# Patient Record
Sex: Male | Born: 1970 | Race: Black or African American | Hispanic: No | Marital: Married | Smoking: Never smoker
Health system: Southern US, Community
[De-identification: ages and names within clinical notes are randomized; demographics above are authoritative.]

## PROBLEM LIST (undated history)

## (undated) DIAGNOSIS — T7840XA Allergy, unspecified, initial encounter: Secondary | ICD-10-CM

## (undated) DIAGNOSIS — K635 Polyp of colon: Secondary | ICD-10-CM

## (undated) DIAGNOSIS — K8020 Calculus of gallbladder without cholecystitis without obstruction: Secondary | ICD-10-CM

## (undated) HISTORY — DX: Allergy, unspecified, initial encounter: T78.40XA

## (undated) HISTORY — DX: Polyp of colon: K63.5

## (undated) HISTORY — DX: Calculus of gallbladder without cholecystitis without obstruction: K80.20

---

## 1994-07-22 HISTORY — PX: WISDOM TOOTH EXTRACTION: SHX21

## 1999-07-23 DIAGNOSIS — K8020 Calculus of gallbladder without cholecystitis without obstruction: Secondary | ICD-10-CM

## 1999-07-23 HISTORY — DX: Calculus of gallbladder without cholecystitis without obstruction: K80.20

## 2005-03-20 ENCOUNTER — Ambulatory Visit: Payer: Self-pay | Admitting: Family Medicine

## 2005-05-13 ENCOUNTER — Ambulatory Visit: Payer: Self-pay | Admitting: Gastroenterology

## 2007-09-28 IMAGING — NM NUCLEAR MEDICINE GASTRIC EMPTYING STUDY
1 series · 6 of 6 positions shown · non-contrast
Comparison: none

REASON FOR EXAM: Dysphagia, epigastric pain
COMMENTS:

PROCEDURE:     NM  - NM GASTRIC EMPTYING STUDY  - May 13, 2005  [DATE]
RESULT:     Following administration of 2.07 millicuries of technetium-99m
sulfur colloid with a solid meal, a gastric emptying study was performed.
Gastric emptying at 95 minutes is 21% which is abnormal.

[Series 0: gastric empty · 1.7mm · 1.65mm/px · 6 of 20 frames shown]
[frame 2/20]
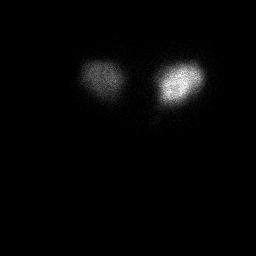
[frame 5/20]
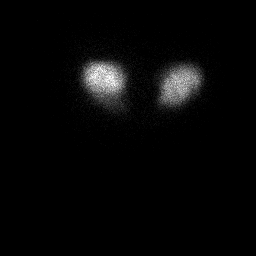
[frame 9/20]
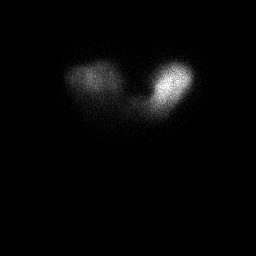
[frame 12/20]
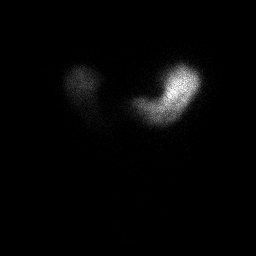
[frame 15/20]
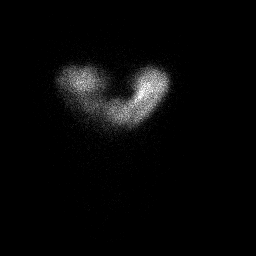
[frame 19/20]
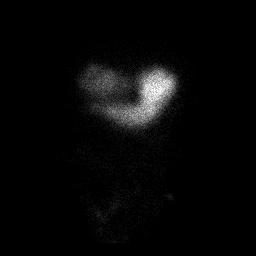

[6 of 6 positions shown; findings below may reference images not displayed]

IMPRESSION: Delayed gastric emptying.

## 2007-11-03 DIAGNOSIS — E881 Lipodystrophy, not elsewhere classified: Secondary | ICD-10-CM | POA: Insufficient documentation

## 2008-07-22 DIAGNOSIS — K635 Polyp of colon: Secondary | ICD-10-CM

## 2008-07-22 HISTORY — DX: Polyp of colon: K63.5

## 2008-09-15 ENCOUNTER — Ambulatory Visit: Payer: Self-pay | Admitting: General Surgery

## 2012-05-12 DIAGNOSIS — E119 Type 2 diabetes mellitus without complications: Secondary | ICD-10-CM | POA: Insufficient documentation

## 2012-10-05 ENCOUNTER — Ambulatory Visit: Payer: Self-pay | Admitting: Family Medicine

## 2012-10-20 ENCOUNTER — Ambulatory Visit: Payer: Self-pay | Admitting: Family Medicine

## 2013-09-01 ENCOUNTER — Ambulatory Visit: Payer: Self-pay | Admitting: General Surgery

## 2013-09-14 ENCOUNTER — Encounter: Payer: Self-pay | Admitting: General Surgery

## 2013-09-14 ENCOUNTER — Other Ambulatory Visit: Payer: Self-pay | Admitting: General Surgery

## 2013-09-14 ENCOUNTER — Ambulatory Visit (INDEPENDENT_AMBULATORY_CARE_PROVIDER_SITE_OTHER): Payer: BC Managed Care – PPO | Admitting: General Surgery

## 2013-09-14 VITALS — BP 148/94 | HR 82 | Resp 14 | Ht 71.0 in | Wt 273.0 lb

## 2013-09-14 DIAGNOSIS — G4730 Sleep apnea, unspecified: Secondary | ICD-10-CM

## 2013-09-14 DIAGNOSIS — Z860100 Personal history of colon polyps, unspecified: Secondary | ICD-10-CM | POA: Insufficient documentation

## 2013-09-14 DIAGNOSIS — Z1211 Encounter for screening for malignant neoplasm of colon: Secondary | ICD-10-CM

## 2013-09-14 MED ORDER — POLYETHYLENE GLYCOL 3350 17 GM/SCOOP PO POWD: 1.0000 | Freq: Once | ORAL | Status: DC

## 2013-09-14 NOTE — Patient Instructions (Addendum)
Colonoscopy A colonoscopy is an exam to look at the entire large intestine (colon). This exam can help find problems such as tumors, polyps, inflammation, and areas of bleeding. The exam takes about 1 hour.  LET [facility] CARE PROVIDER KNOW ABOUT:   Any allergies you have.  All medicines you are taking, including vitamins, herbs, eye drops, creams, and over-the-counter medicines.  Previous problems you or members of your family have had with the use of anesthetics.  Any blood disorders you have.  Previous surgeries you have had.  Medical conditions you have. RISKS AND COMPLICATIONS  Generally, this is a safe procedure. However, as with any procedure, complications can occur. Possible complications include:  Bleeding.  Tearing or rupture of the colon wall.  Reaction to medicines given during the exam.  Infection (rare). BEFORE THE PROCEDURE   Ask your health care provider about changing or stopping your regular medicines.  You may be prescribed an oral bowel prep. This involves drinking a large amount of medicated liquid, starting the day before your procedure. The liquid will cause you to have multiple loose stools until your stool is almost clear or light green. This cleans out your colon in preparation for the procedure.  Do not eat or drink anything else once you have started the bowel prep, unless your health care provider tells you it is safe to do so.  Arrange for someone to drive you home after the procedure. PROCEDURE   You will be given medicine to help you relax (sedative).  You will lie on your side with your knees bent.  A long, flexible tube with a light and camera on the end (colonoscope) will be inserted through the rectum and into the colon. The camera sends video back to a computer screen as it moves through the colon. The colonoscope also releases carbon dioxide gas to inflate the colon. This helps your health care provider see the area better.  During  the exam, your health care provider may take a small tissue sample (biopsy) to be examined under a microscope if any abnormalities are found.  The exam is finished when the entire colon has been viewed. AFTER THE PROCEDURE   Do not drive for 24 hours after the exam.  You may have a small amount of blood in your stool.  You may pass moderate amounts of gas and have mild abdominal cramping or bloating. This is caused by the gas used to inflate your colon during the exam.  Ask when your test results will be ready and how you will get your results. Make sure you get your test results. Document Released: 07/05/2000 Document Revised: 04/28/2013 Document Reviewed: 03/15/2013 [facility] Patient Information 2014 [place].    Patient is scheduled for a colonoscopy at [facility] for 10/12/13. Patient is to take his Tenoretic blood pressure medication at 6:00 am the day of his colonoscopy. patient is aware of date, time, and instructions. patient is aware to pre admit at least 2 days prior to his colonoscopy.

## 2013-09-14 NOTE — Progress Notes (Signed)
Patient ID: Douglas Murray, male   DOB: [birth year], 43 y.o.   MRN: [id number]  Chief Complaint  Patient presents with  . Other    5 year follow up colonoscopy    HPI Douglas Murray is a 43 y.o. male who presents for an evaluation of a colonoscopy. The last colonoscopy was performed on 09/15/2008. The patient denies any problems at this time. He has noticed a little bleeding on tissues and on stool but only when he is constipated.  Bowels generally move daily and no rectal pain.  The patient was originally evaluated in January of 2002 when he was identified with left lower quadrant pain as well as an incidental finding of cholelithiasis on ultrasound. Colonoscopy at that time showed a 1.7 cm pedunculated polyp in the descending colon. Pathology showed this to be a tubular adenoma with a well-defined stalk with clear margins.  Air-contrast barium enema completed 08/26/2003 was notable for a few diverticuli in the descending colon.  Colonoscopy dated 09/15/2008 showed a few diverticula in the sigmoid colon. No evidence of recurrent polyps. The cecum was reached on this exam.  HPI  Past Medical History  Diagnosis Date  . Diabetes mellitus without complication   . Hyperlipidemia   . Colon polyp 2010    Past Surgical History  Procedure Laterality Date  . Colonoscopy  09-15-08    Dr Bary Castilla  . Wisdom tooth extraction  1996    History reviewed. No pertinent family history.  Social History History  Substance Use Topics  . Smoking status: Never Smoker   . Smokeless tobacco: Never Used  . Alcohol Use: No    No Known Allergies  Current Outpatient Prescriptions  Medication Sig Dispense Refill  . atenolol-chlorthalidone (TENORETIC) 50-25 MG per tablet Take 1 tablet by mouth daily.      . polyethylene glycol powder (GLYCOLAX/MIRALAX) powder Take 255 g (1 Container total) by mouth once.  255 g  0   No current facility-administered medications for this visit.    Review of  Systems Review of Systems  Constitutional: Negative.   Respiratory: Negative.   Cardiovascular: Negative.   Gastrointestinal: Positive for constipation.    Blood pressure 148/94, pulse 82, resp. rate 14, height 5\' 11"  (1.803 m), weight 273 lb (123.832 kg).  Physical Exam Physical Exam  Constitutional: He is oriented to person, place, and time. He appears well-developed and well-nourished.  Neck: Neck supple.  Cardiovascular: Normal rate, regular rhythm and normal heart sounds.   Pulmonary/Chest: Effort normal and breath sounds normal.  Abdominal: Soft. Bowel sounds are normal.  Lymphadenopathy:    He has no cervical adenopathy.  Neurological: He is alert and oriented to person, place, and time.  Skin: Skin is warm and dry.    Data Reviewed See above.   Assessment    Previously resected 1.7 cm tubular adenoma of the descending colon.  History of occasional bright red blood on the toilet tissue or on the leading edge of the stool    Plan    At the time of his previous colonoscopies significant snoring and a suggestion of sleep apnea was identified. The patient had been encouraged to discuss with his primary physician, Douglas Murray, M.D., evaluation for sleep apnea. He received a referral but never followed through.  He reports his wife still tells me stores significantly. Considering his age, race, hypertension and diabetes, if indeed he does have sleep apnea he is at high risk for pulmonary hypertension and an early demise. Importance of  formal evaluation was encouraged.  Indications for repeat colonoscopy were reviewed as well as the risks and benefits of the procedure. Colonoscopy with possible biopsy/polypectomy prn: Information regarding the procedure, including its potential risks and complications (including but not limited to perforation of the bowel, which may require emergency surgery to repair, and bleeding) was verbally given to the patient. Educational information  regarding lower instestinal endoscopy was given to the patient. Written instructions for how to complete the bowel prep using Miralax were provided. The importance of drinking ample fluids to avoid dehydration as a result of the prep emphasized.     Patient is scheduled for a colonoscopy at [facility] for 10/12/13. Patient is to take his Tenoretic blood pressure medication at 6:00 am the day of his colonoscopy. patient is aware of date, time, and instructions. patient is aware to pre admit at least 2 days prior to his colonoscopy. Miralax prescription sent into his pharmacy.  Robert Bellow 09/14/2013, 1:25 PM

## 2013-10-11 HISTORY — PX: COLONOSCOPY: SHX174

## 2013-10-12 ENCOUNTER — Ambulatory Visit: Payer: Self-pay | Admitting: General Surgery

## 2013-10-12 DIAGNOSIS — D128 Benign neoplasm of rectum: Secondary | ICD-10-CM

## 2013-10-12 DIAGNOSIS — D129 Benign neoplasm of anus and anal canal: Secondary | ICD-10-CM

## 2013-10-12 LAB — HM COLONOSCOPY

## 2013-10-13 ENCOUNTER — Encounter: Payer: Self-pay | Admitting: General Surgery

## 2013-10-16 LAB — PATHOLOGY REPORT

## 2013-10-18 ENCOUNTER — Telehealth: Payer: Self-pay | Admitting: *Deleted

## 2013-10-18 ENCOUNTER — Encounter: Payer: Self-pay | Admitting: General Surgery

## 2013-10-18 NOTE — Telephone Encounter (Signed)
Message copied by Chrystie Nose on Mon Oct 18, 2013 12:59 PM ------      Message from: [place], Forest Gleason      Created: Mon Oct 18, 2013 10:58 AM       Janett Billow: Please notify the patient of the pathology was benign. As he has had 2 normal exams we'll plan for repeat study in 8 years.            Michelle: Please put the patient recalls for 2023. Thank you      ----- Message -----         From: Verlene Mayer, CMA         Sent: 10/18/2013   9:28 AM           To: Robert Bellow, MD                   ------

## 2013-10-18 NOTE — Telephone Encounter (Signed)
Notified patient as instructed, patient pleased. Discussed follow-up appointments, patient agrees  

## 2013-11-12 ENCOUNTER — Ambulatory Visit: Payer: Self-pay | Admitting: Family Medicine

## 2013-11-12 DIAGNOSIS — G4733 Obstructive sleep apnea (adult) (pediatric): Secondary | ICD-10-CM | POA: Insufficient documentation

## 2014-10-10 LAB — LIPID PANEL
Cholesterol: 198 mg/dL (ref 0–200)
HDL: 27 mg/dL — AB (ref 35–70)
LDL CALC: 149 mg/dL
Triglycerides: 112 mg/dL (ref 40–160)

## 2014-10-10 LAB — HEMOGLOBIN A1C: Hgb A1c MFr Bld: 6.7 % — AB (ref 4.0–6.0)

## 2014-10-10 LAB — BASIC METABOLIC PANEL
BUN: 9 mg/dL (ref 4–21)
Creatinine: 1.3 mg/dL (ref <=1.3)
GLUCOSE: 135 mg/dL
Potassium: 4 mmol/L (ref 3.4–5.3)
Sodium: 142 mmol/L (ref 137–147)

## 2015-03-10 DIAGNOSIS — E669 Obesity, unspecified: Secondary | ICD-10-CM | POA: Insufficient documentation

## 2015-03-10 DIAGNOSIS — L309 Dermatitis, unspecified: Secondary | ICD-10-CM | POA: Insufficient documentation

## 2015-03-13 ENCOUNTER — Encounter: Payer: Self-pay | Admitting: Family Medicine

## 2015-03-13 ENCOUNTER — Ambulatory Visit (INDEPENDENT_AMBULATORY_CARE_PROVIDER_SITE_OTHER): Payer: 59 | Admitting: Family Medicine

## 2015-03-13 VITALS — BP 118/80 | HR 56 | Temp 98.9°F | Resp 16 | Ht 71.0 in | Wt 261.0 lb

## 2015-03-13 DIAGNOSIS — E669 Obesity, unspecified: Secondary | ICD-10-CM | POA: Diagnosis not present

## 2015-03-13 DIAGNOSIS — E119 Type 2 diabetes mellitus without complications: Secondary | ICD-10-CM

## 2015-03-13 DIAGNOSIS — I10 Essential (primary) hypertension: Secondary | ICD-10-CM | POA: Diagnosis not present

## 2015-03-13 DIAGNOSIS — E881 Lipodystrophy, not elsewhere classified: Secondary | ICD-10-CM

## 2015-03-13 LAB — POCT GLYCOSYLATED HEMOGLOBIN (HGB A1C)
ESTIMATED AVERAGE GLUCOSE: 157
Hemoglobin A1C: 7.1

## 2015-03-13 MED ORDER — METFORMIN HCL ER 500 MG PO TB24: 500.0000 mg | ORAL_TABLET | Freq: Every day | ORAL | Status: DC

## 2015-03-13 NOTE — Progress Notes (Deleted)
Patient: Douglas Murray, Male    DOB: May 15, 1971, 44 y.o.   MRN: [id number] Visit Date: 03/13/2015  Today's Provider: Lelon Huh, MD   Chief Complaint  Patient presents with  . Follow-up    5 months  . Diabetes  . Hypertension   Subjective:    Annual wellness visit Douglas Murray is a 44 y.o. male. He feels {DESC; WELL/FAIRLY WELL/POORLY:18703}. He reports exercising ***. He reports he is sleeping {DESC; WELL/FAIRLY WELL/POORLY:18703}.  -----------------------------------------------------------   Review of Systems  Social History   Social History  . Marital Status: Married    Spouse Name: N/A  . Number of Children: N/A  . Years of Education: N/A   Occupational History  . Not on file.   Social History Main Topics  . Smoking status: Never Smoker   . Smokeless tobacco: Never Used  . Alcohol Use: No  . Drug Use: No  . Sexual Activity: Not on file   Other Topics Concern  . Not on file   Social History Narrative    Patient Active Problem List   Diagnosis Date Noted  . Eczema 03/10/2015  . HLD (hyperlipidemia) 03/10/2015  . Obesity 03/10/2015  . Obstructive sleep apnea of adult 11/12/2013  . Personal history of colonic polyps 09/14/2013  . Diabetes mellitus, type 2 05/12/2012  . Diverticulosis of colon without hemorrhage 09/15/2008  . 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 Allergic rhinitis 08/01/2006  . Essential (primary) hypertension 08/01/2006  . History of colon polyps 08/12/2000    Past Surgical History  Procedure Laterality Date  . Colonoscopy  09-15-08    Dr Bary Castilla  . Wisdom tooth extraction  1996    His family history includes Cancer in his maternal aunt; Diabetes in his mother.    Previous Medications   ATENOLOL-CHLORTHALIDONE (TENORETIC) 50-25 MG PER TABLET    Take 1 tablet by mouth daily.   POLYETHYLENE GLYCOL POWDER (GLYCOLAX/MIRALAX) POWDER    Take 255 g (1 Container total) by mouth once.   PRAVASTATIN (PRAVACHOL) 20 MG TABLET     Take 1 tablet by mouth daily.    Patient Care Team: Birdie Sons, MD as PCP - General (Family Medicine) Robert Bellow, MD (General Surgery)     Objective:   Vitals: There were no vitals taken for this visit.  Physical Exam  Activities of Daily Living No flowsheet data found.  Fall Risk Assessment No flowsheet data found.   Depression Screen No flowsheet data found.  Cognitive Testing - 6-CIT  Correct? Score   What year is it? {yes no:22349} {0-4:31231} 0 or 4  What month is it? {yes no:22349} {0-3:21082} 0 or 3  Memorize:    Pia Mau,  43,  [address],  [place],      What time is it? (within 1 hour) {yes no:22349} {0-3:21082} 0 or 3  Count backwards from 20 {yes no:22349} {0-4:31231} 0, 2, or 4  Name the months of the year {yes no:22349} {0-4:31231} 0, 2, or 4  Repeat name & address above {yes no:22349} {0-10:5044} 0, 2, 4, 6, 8, or 10       TOTAL SCORE  ***/28   Interpretation:  {normal/abnormal:11317::"Normal"}  Normal (0-7) Abnormal (8-28)       Assessment & Plan:     Annual Wellness Visit  Reviewed patient's Family Medical History Reviewed and updated list of patient's medical providers Assessment of cognitive impairment was done Assessed patient's functional ability Established a written schedule for health  screening services Health Risk Assessent Completed and Reviewed  Exercise Activities and Dietary recommendations Goals    None      Immunization History  Administered Date(s) Administered  . Pneumococcal Polysaccharide-23 06/02/2013  . Td 07/23/2005  . Tdap 10/23/2007    Health Maintenance  Topic Date Due  . FOOT EXAM  07/06/1982  . OPHTHALMOLOGY EXAM  07/06/1982  . URINE MICROALBUMIN  07/06/1982  . HIV Screening  07/07/1987  . INFLUENZA VACCINE  02/20/2015  . HEMOGLOBIN A1C  04/12/2015  . TETANUS/TDAP  10/22/2017  . PNEUMOCOCCAL POLYSACCHARIDE VACCINE (2) 06/02/2018      Discussed health benefits of physical activity,  and encouraged him to engage in regular exercise appropriate for his age and condition.    ------------------------------------------------------------------------------------------------------------  c

## 2015-03-13 NOTE — Progress Notes (Signed)
Patient: Douglas Murray Male    DOB: 05/22/1971   44 y.o.   MRN: [id number] Visit Date: 03/13/2015  Today's Provider: Lelon Huh, MD   Chief Complaint  Patient presents with  . Follow-up    5 months  . Diabetes  . Hypertension   Subjective:    HPI  Follow-up for lipodystrophy from 10/10/2014; started pravastatin 20 mg qd. Is tolerating pravastatin well with no adverse effects.  Lab Results  Component Value Date   CHOL 198 10/10/2014   HDL 27* 10/10/2014   LDLCALC 149 10/10/2014   TRIG 112 10/10/2014      Diabetes Mellitus Type II, Follow-up:   Lab Results  Component Value Date   HGBA1C 6.7* 10/10/2014   Last seen for diabetes 5 months ago.  Management changes included none. He reports excellent compliance with treatment. He is not having side effects. none Current symptoms include none and have been stable. Home blood sugar records: fasting range: 126  Episodes of hypoglycemia? no   Weight trend: stable Prior visit with dietician: no Current diet: in general, an "unhealthy" diet Current exercise: walking  ------------------------------------------------------------------------   Hypertension, follow-up:  BP Readings from Last 3 Encounters:  03/13/15 118/80  10/10/14 132/96  09/14/13 148/94    He was last seen for hypertension 5 months ago.  BP at that visit was 132/88. Management changes since that visit include none .He reports excellent compliance with treatment. He is not having side effects. none  He is exercising. He is not adherent to low salt diet.   Outside blood pressures are yes. He is experiencing none.  Patient denies none.   Cardiovascular risk factors include diabetes mellitus.  Use of agents associated with hypertension: none.   ------------------------------------------------------------------------      No Known Allergies Previous Medications   ATENOLOL-CHLORTHALIDONE (TENORETIC) 50-25 MG PER TABLET    Take  1 tablet by mouth daily.   PRAVASTATIN (PRAVACHOL) 20 MG TABLET    Take 1 tablet by mouth daily.    Review of Systems  Constitutional: Negative for fever, chills and appetite change.  Respiratory: Negative for chest tightness, shortness of breath and wheezing.   Cardiovascular: Negative for chest pain and palpitations.  Gastrointestinal: Negative for nausea, vomiting and abdominal pain.    Social History  Substance Use Topics  . Smoking status: Never Smoker   . Smokeless tobacco: Never Used  . Alcohol Use: No   Objective:   BP 118/80 mmHg  Pulse 56  Temp(Src) 98.9 F (37.2 C) (Oral)  Resp 16  Ht 5\' 11"  (1.803 m)  Wt 261 lb (118.389 kg)  BMI 36.42 kg/m2  SpO2 97%  Physical Exam  General appearance: alert, well developed, well nourished, cooperative and in no distress, obese Head: Normocephalic, without obvious abnormality, atraumatic Lungs: Respirations even and unlabored Extremities: No gross deformities Skin: Skin color, texture, turgor normal. No rashes seen  Psych: Appropriate mood and affect. Neurologic: Mental status: Alert, oriented to person, place, and time, thought content appropriate.   Results for orders placed or performed in visit on 03/13/15  POCT glycosylated hemoglobin (Hb A1C)  Result Value Ref Range   Hemoglobin A1C 7.1    Est. average glucose Bld gHb Est-mCnc 157         Assessment & Plan:     1. Type 2 diabetes mellitus without complication Start metformin ER 500mg  daily. Recheck A1c in 3-4 months.  - POCT glycosylated hemoglobin (Hb A1C)  2. Essential (primary)  hypertension well controlled Continue current medications.   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   - Lipid panel - ALT  4. Obesity Diet ane exercise.        Lelon Huh, MD  [facility]

## 2015-03-14 ENCOUNTER — Telehealth: Payer: Self-pay | Admitting: *Deleted

## 2015-03-14 LAB — RENAL FUNCTION PANEL
ALBUMIN: 4.4 g/dL (ref 3.5–5.5)
BUN/Creatinine Ratio: 8 — ABNORMAL LOW (ref 9–20)
BUN: 10 mg/dL (ref 6–24)
CO2: 28 mmol/L (ref 18–29)
Calcium: 9.8 mg/dL (ref 8.7–10.2)
Chloride: 100 mmol/L (ref 97–108)
Creatinine, Ser: 1.28 mg/dL — ABNORMAL HIGH (ref 0.76–1.27)
GFR calc Af Amer: 79 mL/min/{1.73_m2} (ref >59)
GFR, EST NON AFRICAN AMERICAN: 69 mL/min/{1.73_m2} (ref >59)
GLUCOSE: 138 mg/dL — AB (ref 65–99)
PHOSPHORUS: 3.7 mg/dL (ref 2.5–4.5)
POTASSIUM: 3.7 mmol/L (ref 3.5–5.2)
Sodium: 143 mmol/L (ref 134–144)

## 2015-03-14 LAB — LIPID PANEL
CHOLESTEROL TOTAL: 170 mg/dL (ref 100–199)
Chol/HDL Ratio: 6.3 ratio units — ABNORMAL HIGH (ref 0.0–5.0)
HDL: 27 mg/dL — AB (ref >39)
LDL Calculated: 123 mg/dL — ABNORMAL HIGH (ref 0–99)
TRIGLYCERIDES: 99 mg/dL (ref 0–149)
VLDL Cholesterol Cal: 20 mg/dL (ref 5–40)

## 2015-03-14 LAB — ALT: ALT: 33 IU/L (ref 0–44)

## 2015-03-14 NOTE — Telephone Encounter (Signed)
-----   Message from Birdie Sons, MD sent at 03/14/2015  8:03 AM EDT ----- LDL cholesterol improved from 149 to 123. Need to get it below 100. Increase pravastatin to 40mg  daily, #30, rf x 4. Follow up o.v. And labs in December as scheduled.

## 2015-04-27 ENCOUNTER — Other Ambulatory Visit: Payer: Self-pay | Admitting: Family Medicine

## 2015-07-10 ENCOUNTER — Ambulatory Visit (INDEPENDENT_AMBULATORY_CARE_PROVIDER_SITE_OTHER): Payer: 59 | Admitting: Family Medicine

## 2015-07-10 ENCOUNTER — Encounter: Payer: Self-pay | Admitting: Family Medicine

## 2015-07-10 VITALS — BP 132/90 | HR 62 | Temp 98.4°F | Resp 16 | Ht 72.0 in | Wt 268.0 lb

## 2015-07-10 DIAGNOSIS — E1165 Type 2 diabetes mellitus with hyperglycemia: Secondary | ICD-10-CM

## 2015-07-10 DIAGNOSIS — Z23 Encounter for immunization: Secondary | ICD-10-CM | POA: Diagnosis not present

## 2015-07-10 DIAGNOSIS — IMO0001 Reserved for inherently not codable concepts without codable children: Secondary | ICD-10-CM

## 2015-07-10 LAB — POCT GLYCOSYLATED HEMOGLOBIN (HGB A1C)
ESTIMATED AVERAGE GLUCOSE: 180
HEMOGLOBIN A1C: 7.9

## 2015-07-10 NOTE — Patient Instructions (Addendum)
   Go ahead and start taking metformin 500mg  every day with breakfast  Call if unable to tolerate medication, or if refills run out before your next office visit.

## 2015-07-10 NOTE — Progress Notes (Signed)
Patient: Douglas Murray Male    DOB: [birth year]   44 y.o.   MRN: [id number] Visit Date: 07/10/2015  Today's Provider: Lelon Huh, MD   Chief Complaint  Patient presents with  . Diabetes    follow up  . Hypertension    follow up  . Hyperlipidemia    follow up   Subjective:    HPI   Diabetes Mellitus Type II, Follow-up:   Lab Results  Component Value Date   HGBA1C 7.1 03/13/2015   HGBA1C 6.7* 10/10/2014   Last seen for diabetes 4 months ago.  Management since then includes  Starting Metformin ER 500mg  daily. He reports poor compliance with treatment. He did fill prescription for metformin, but never started taking it. He still has full bottle at home with refills.   Current symptoms include polydipsia and have been stable. Home blood sugar records: 130's in the afternoon  Episodes of hypoglycemia? no   Current Insulin Regimen: none Most Recent Eye Exam: 1 year Weight trend: increasing steadily Prior visit with dietician: no Current diet: well balanced Current exercise: cardiovascular workout on exercise equipment  Wt Readings from Last 3 Encounters:  07/10/15 268 lb (121.564 kg)  03/13/15 261 lb (118.389 kg)  10/10/14 262 lb (118.842 kg)    ------------------------------------------------------------------------   Hypertension, follow-up:  BP Readings from Last 3 Encounters:  03/13/15 118/80  10/10/14 132/96  09/14/13 148/94    He was last seen for hypertension 4 months ago.  BP at that visit was 118/80. Management since that visit includes no changes .He reports good compliance with treatment. He is not having side effects.  He is exercising. He is adherent to low salt diet.   Outside blood pressures are being checked but patient does not remember the readings. He is experiencing fatigue.  Patient denies chest pain, chest pressure/discomfort, claudication, dyspnea, exertional chest pressure/discomfort, irregular heart beat, lower  extremity edema, near-syncope, orthopnea, palpitations, paroxysmal nocturnal dyspnea, syncope and tachypnea.   Cardiovascular risk factors include diabetes mellitus, dyslipidemia, hypertension and male gender.  Use of agents associated with hypertension: none.   ------------------------------------------------------------------------    Lipid/Cholesterol, Follow-up:   Last seen for this 4 months ago.  Management since that visit includes increasing Pravastatin to 40mg  daily.  Last Lipid Panel:    Component Value Date/Time   CHOL 170 03/13/2015 0926   CHOL 198 10/10/2014   TRIG 99 03/13/2015 0926   HDL 27* 03/13/2015 0926   HDL 27* 10/10/2014   CHOLHDL 6.3* 03/13/2015 0926   LDLCALC 123* 03/13/2015 0926   LDLCALC 149 10/10/2014    He reports poor compliance with treatment. Patient did not increase to 40mg s. Patient states the pharmacy continued to give him the 20mg  tablets. He is not having side effects.   Wt Readings from Last 3 Encounters:  03/13/15 261 lb (118.389 kg)  10/10/14 262 lb (118.842 kg)  09/14/13 273 lb (123.832 kg)    ------------------------------------------------------------------------        No Known Allergies Previous Medications   ATENOLOL-CHLORTHALIDONE (TENORETIC) 50-25 MG PER TABLET    Take 1 tablet by mouth daily.   METFORMIN (GLUCOPHAGE-XR) 500 MG 24 HR TABLET    Take 1 tablet (500 mg total) by mouth daily with breakfast.   PRAVASTATIN (PRAVACHOL) 20 MG TABLET    TAKE 1 TABLET DAILY    Review of Systems  Constitutional: Positive for fatigue. Negative for fever, chills and appetite change.  Respiratory: Negative for chest tightness, shortness of  breath and wheezing.   Cardiovascular: Negative for chest pain and palpitations.  Gastrointestinal: Negative for nausea, vomiting and abdominal pain.  Endocrine: Positive for polydipsia. Negative for polyphagia and polyuria.  Neurological: Positive for dizziness.    Social History  Substance  Use Topics  . Smoking status: Never Smoker   . Smokeless tobacco: Never Used  . Alcohol Use: No   Objective:   BP 132/90 mmHg  Pulse 62  Temp(Src) 98.4 F (36.9 C) (Oral)  Resp 16  Ht 6' (1.829 m)  Wt 268 lb (121.564 kg)  BMI 36.34 kg/m2  SpO2 97%  Physical Exam  General Appearance:    Alert, cooperative, no distress, obese  Eyes:    PERRL, conjunctiva/corneas clear, EOM's intact       Lungs:     Clear to auscultation bilaterally, respirations unlabored  Heart:    Regular rate and rhythm  Neurologic:   Awake, alert, oriented x 3. No apparent focal neurological           defect.       Results for orders placed or performed in visit on 07/10/15  POCT HgB A1C  Result Value Ref Range   Hemoglobin A1C 7.9    Est. average glucose Bld gHb Est-mCnc 180          Assessment & Plan:     1. Uncontrolled type 2 diabetes mellitus without complication, without long-term current use of insulin ([place]) Has not yet started metformin. Advised of health benefits of lowering his A1c and benefits of taking metformin.  He is going to start taking medication and follow up in 3 months to check A1c.  - POCT HgB A1C  2. Need for influenza vaccination  - Flu Vaccine QUAD 36+ mos IM       Lelon Huh, MD  [facility]

## 2015-09-13 ENCOUNTER — Ambulatory Visit (INDEPENDENT_AMBULATORY_CARE_PROVIDER_SITE_OTHER): Payer: 59 | Admitting: Family Medicine

## 2015-09-13 ENCOUNTER — Encounter: Payer: Self-pay | Admitting: Family Medicine

## 2015-09-13 VITALS — BP 130/80 | HR 76 | Temp 99.3°F | Resp 16 | Ht 72.0 in | Wt 267.0 lb

## 2015-09-13 DIAGNOSIS — H60321 Hemorrhagic otitis externa, right ear: Secondary | ICD-10-CM

## 2015-09-13 DIAGNOSIS — M7652 Patellar tendinitis, left knee: Secondary | ICD-10-CM

## 2015-09-13 MED ORDER — NEOMYCIN-POLYMYXIN-HC 3.5-10000-1 OT SOLN: 3.0000 [drp] | Freq: Four times a day (QID) | OTIC | Status: AC

## 2015-09-13 MED ORDER — NAPROXEN 500 MG PO TABS: 500.0000 mg | ORAL_TABLET | Freq: Two times a day (BID) | ORAL | Status: DC

## 2015-09-13 NOTE — Progress Notes (Signed)
Patient: Douglas Murray Male    DOB: 02-05-1971   45 y.o.   MRN: [id number] Visit Date: 09/13/2015  Today's Provider: Lelon Huh, MD   Chief Complaint  Patient presents with  . Knee Pain    left   Subjective:    Knee Pain  The incident occurred more than 1 week ago (2 weeks). Incident location: not sure. There was no injury mechanism. The pain is present in the left knee. The quality of the pain is described as aching. The pain is at a severity of 5/10. The pain is moderate. The pain has been fluctuating since onset. Associated symptoms include a loss of motion and muscle weakness. Pertinent negatives include no inability to bear weight, loss of sensation, numbness or tingling. He reports no foreign bodies present. The symptoms are aggravated by palpation and weight bearing. He has tried acetaminophen and rest (tylenol) for the symptoms. The treatment provided mild relief.  Otalgia  There is pain in the right ear. This is a new problem. The current episode started in the past 7 days (02/05/16). The problem occurs constantly. The problem has been unchanged. There has been no fever. The pain is mild. Pertinent negatives include no abdominal pain, coughing, ear discharge, headaches, hearing loss, neck pain, rash, rhinorrhea, sore throat or vomiting. Treatments tried: otc cold med. The treatment provided moderate relief. There is no history of a chronic ear infection, hearing loss or a tympanostomy tube.   Left knee started aching 2 weeks ago. Yesterday left knee became painful when bearing weight. No swelling, no radiation. No apparent injury. Some pain in left foot, on top.   Right ear pain for the past 5 days. Ear pain is mild with ringing in the ear. Patient is just getting over cold symptoms.  No Known Allergies Previous Medications   ATENOLOL-CHLORTHALIDONE (TENORETIC) 50-25 MG PER TABLET    Take 1 tablet by mouth daily.   METFORMIN (GLUCOPHAGE-XR) 500 MG 24 HR TABLET    Take  1 tablet (500 mg total) by mouth daily with breakfast.   PRAVASTATIN (PRAVACHOL) 20 MG TABLET    TAKE 1 TABLET DAILY    Review of Systems  Constitutional: Negative for fever, chills and appetite change.  HENT: Positive for ear pain. Negative for ear discharge, hearing loss, rhinorrhea and sore throat.   Respiratory: Negative for cough, chest tightness, shortness of breath and wheezing.   Cardiovascular: Negative for chest pain and palpitations.  Gastrointestinal: Negative for nausea, vomiting and abdominal pain.  Musculoskeletal: Positive for arthralgias. Negative for neck pain.  Skin: Negative for rash.  Neurological: Negative for tingling, numbness and headaches.    Social History  Substance Use Topics  . Smoking status: Never Smoker   . Smokeless tobacco: Never Used  . Alcohol Use: No   Objective:   BP 130/80 mmHg  Pulse 76  Temp(Src) 99.3 F (37.4 C) (Oral)  Resp 16  Ht 6' (1.829 m)  Wt 267 lb (121.11 kg)  BMI 36.20 kg/m2  SpO2 97%  Physical Exam  General Appearance:    Alert, cooperative, no distress  HENT:   neck without nodes and sinuses nontender. Right ear canal red and inflamed  Eyes:    PERRL, conjunctiva/corneas clear, EOM's intact       MS:   Tender left patella tender superior to patella. Pain illicited with knee extension against resistance.        Assessment & Plan:     1. Patellar  tendonitis of left knee May be exacerbated by increase in treadmill exercising the last few weeks. Will back down a bit on treadmill and do more gliding. Apply ice after exercising. Start naprosyn for the next week, call if not greatly improved within a week. Consider orthopedic referral - naproxen (NAPROSYN) 500 MG tablet; Take 1 tablet (500 mg total) by mouth 2 (two) times daily with a meal.  Dispense: 30 tablet; Refill: 0  2. Otitis externa hemorrhagica, right - neomycin-polymyxin-hydrocortisone (CORTISPORIN) otic solution; Place 3 drops into the right ear 4 (four) times  daily.  Dispense: 10 mL; Refill: 0        Lelon Huh, MD  [facility]

## 2015-10-10 ENCOUNTER — Encounter: Payer: Self-pay | Admitting: Family Medicine

## 2015-10-10 ENCOUNTER — Ambulatory Visit (INDEPENDENT_AMBULATORY_CARE_PROVIDER_SITE_OTHER): Payer: 59 | Admitting: Family Medicine

## 2015-10-10 VITALS — BP 116/70 | HR 69 | Temp 98.6°F | Resp 16 | Ht 72.0 in | Wt 266.0 lb

## 2015-10-10 DIAGNOSIS — I10 Essential (primary) hypertension: Secondary | ICD-10-CM | POA: Diagnosis not present

## 2015-10-10 DIAGNOSIS — E119 Type 2 diabetes mellitus without complications: Secondary | ICD-10-CM

## 2015-10-10 LAB — POCT GLYCOSYLATED HEMOGLOBIN (HGB A1C)
Est. average glucose Bld gHb Est-mCnc: 183
HEMOGLOBIN A1C: 8

## 2015-10-10 NOTE — Progress Notes (Signed)
Patient: Douglas Murray Male    DOB: May 08, 1971   45 y.o.   MRN: [id number] Visit Date: 10/10/2015  Today's Provider: Lelon Huh, MD   Chief Complaint  Patient presents with  . Follow-up  . Diabetes  . Hypertension   Subjective:    HPI   Diabetes Mellitus Type II, Follow-up:   Lab Results  Component Value Date   HGBA1C 7.9 07/10/2015   HGBA1C 7.1 03/13/2015   HGBA1C 6.7* 10/10/2014   Last seen for diabetes 7 months ago.  Management since then includes; started metformin 500 mg qd. He reports good compliance with treatment. He is not having side effects. none Current symptoms include none and have been stable. Home blood sugar records: fasting range: 135  Episodes of hypoglycemia? no   Current Insulin Regimen: n/a Most Recent Eye Exam: due Weight trend: stable Prior visit with dietician: no Current diet: in general, an "unhealthy" diet Current exercise: gym  ----------------------------------------------------------------------    Hypertension, follow-up:  BP Readings from Last 3 Encounters:  10/10/15 116/70  09/13/15 130/80  07/10/15 132/90    He was last seen for hypertension 7 months ago.  BP at that visit was 118/80. Management since that visit includes; no changes.He reports good compliance with treatment. He is not having side effects. none  He is exercising. He is not adherent to low salt diet.   Outside blood pressures are normal. He is experiencing none.  Patient denies none.   Cardiovascular risk factors include diabetes mellitus.  Use of agents associated with hypertension: none.   He admits to following diet very well the last few months, and has not been exercising due to knee pain.  ----------------------------------------------------------------------   Wt Readings from Last 3 Encounters:  10/10/15 266 lb (120.657 kg)  09/13/15 267 lb (121.11 kg)  07/10/15 268 lb (121.564 kg)      No Known Allergies Previous  Medications   ATENOLOL-CHLORTHALIDONE (TENORETIC) 50-25 MG PER TABLET    Take 1 tablet by mouth daily.   METFORMIN (GLUCOPHAGE-XR) 500 MG 24 HR TABLET    Take 1 tablet (500 mg total) by mouth daily with breakfast.   NAPROXEN (NAPROSYN) 500 MG TABLET    Take 1 tablet (500 mg total) by mouth 2 (two) times daily with a meal.   PRAVASTATIN (PRAVACHOL) 20 MG TABLET    TAKE 1 TABLET DAILY    Review of Systems  Constitutional: Negative for fever, chills and appetite change.  Respiratory: Negative for chest tightness, shortness of breath and wheezing.   Cardiovascular: Negative for chest pain and palpitations.  Gastrointestinal: Negative for nausea, vomiting and abdominal pain.  Genitourinary: Positive for flank pain.    Social History  Substance Use Topics  . Smoking status: Never Smoker   . Smokeless tobacco: Never Used  . Alcohol Use: No   Objective:   BP 116/70 mmHg  Pulse 69  Temp(Src) 98.6 F (37 C) (Oral)  Resp 16  Ht 6' (1.829 m)  Wt 266 lb (120.657 kg)  BMI 36.07 kg/m2  SpO2 96%  Physical Exam  General Appearance:    Alert, cooperative, no distress, obese  Eyes:    PERRL, conjunctiva/corneas clear, EOM's intact       Lungs:     Clear to auscultation bilaterally, respirations unlabored  Heart:    Regular rate and rhythm  Neurologic:   Awake, alert, oriented x 3. No apparent focal neurological  defect.     Results for orders placed or performed in visit on 10/10/15  POCT glycosylated hemoglobin (Hb A1C)  Result Value Ref Range   Hemoglobin A1C 8.0    Est. average glucose Bld gHb Est-mCnc 183         Assessment & Plan:     1. Type 2 diabetes mellitus without complication, without long-term current use of insulin (HCC) A1c up slightly since starting metformin, but has not been following diet and exercise program, he is to work on working up to 132minutes CV exercise a week and cut back on carbs.  - POCT glycosylated hemoglobin (Hb A1C)  2. Essential  (primary) hypertension Well controlled.  Continue current medications.    Return in about 4 months (around 02/09/2016).        Lelon Huh, MD  [facility]

## 2015-10-10 NOTE — Patient Instructions (Signed)
   Work your way up to exercising 150 minutes every week   Stay away from high foods, such as white starches and sweets

## 2015-11-20 ENCOUNTER — Encounter: Payer: Self-pay | Admitting: Family Medicine

## 2015-11-20 ENCOUNTER — Ambulatory Visit (INDEPENDENT_AMBULATORY_CARE_PROVIDER_SITE_OTHER): Payer: Commercial Managed Care - HMO | Admitting: Family Medicine

## 2015-11-20 VITALS — BP 112/70 | HR 70 | Temp 98.6°F | Resp 16 | Ht 72.0 in | Wt 263.0 lb

## 2015-11-20 DIAGNOSIS — K921 Melena: Secondary | ICD-10-CM

## 2015-11-20 DIAGNOSIS — Z86010 Personal history of colonic polyps: Secondary | ICD-10-CM | POA: Diagnosis not present

## 2015-11-20 NOTE — Progress Notes (Signed)
       Patient: Douglas Murray Male    DOB: [birth year]   45 y.o.   MRN: [id number] Visit Date: 11/20/2015  Today's Provider: Lelon Huh, MD   Chief Complaint  Patient presents with  . Blood In Stools   Subjective:    HPI  Noticed blood in stool 1 week ago. Has had diarrhea for 2 days. Bloated and has a lot of gas. Also has been feeling fatigued since before he noticed blood in stool. Denies any rectal pain or masses. 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 Had colonoscopy in 2010 with diverticulosis but no polyps.    No Known Allergies Previous Medications   ATENOLOL-CHLORTHALIDONE (TENORETIC) 50-25 MG PER TABLET    Take 1 tablet by mouth daily.   METFORMIN (GLUCOPHAGE-XR) 500 MG 24 HR TABLET    Take 1 tablet (500 mg total) by mouth daily with breakfast.   NAPROXEN (NAPROSYN) 500 MG TABLET    Take 1 tablet (500 mg total) by mouth 2 (two) times daily with a meal.   PRAVASTATIN (PRAVACHOL) 20 MG TABLET    TAKE 1 TABLET DAILY    Review of Systems  Constitutional: Positive for fatigue. Negative for fever, chills and appetite change.  Respiratory: Negative for chest tightness, shortness of breath and wheezing.   Cardiovascular: Negative for chest pain and palpitations.  Gastrointestinal: Positive for diarrhea, blood in stool and abdominal distention. Negative for nausea, vomiting and abdominal pain.    Social History  Substance Use Topics  . Smoking status: Never Smoker   . Smokeless tobacco: Never Used  . Alcohol Use: No   Objective:   BP 112/70 mmHg  Pulse 70  Temp(Src) 98.6 F (37 C) (Oral)  Resp 16  Ht 6' (1.829 m)  Wt 263 lb (119.296 kg)  BMI 35.66 kg/m2  SpO2 97%  Physical Exam  General appearance: alert, well developed, well nourished, cooperative and in no distress Head: Normocephalic, without obvious abnormality, atraumatic Lungs: Respirations even and unlabored Extremities: No gross deformities Skin: Skin color, texture,  turgor normal. No rashes seen  Psych: Appropriate mood and affect. Neurologic: Mental status: Alert, oriented to person, place, and time, thought content appropriate.     Assessment & Plan:     1. Frank blood in stool HD stable.  Needs follow up colonoscopy. Call if bleeding becomes heavy, any bleeding between bowel movements, of if feeling increasing weak or short of breath.  - Ambulatory referral to General Surgery  2. Personal history of colonic polyps  - Ambulatory referral to [place], MD  [facility]

## 2015-11-21 ENCOUNTER — Encounter: Payer: Self-pay | Admitting: General Surgery

## 2015-12-05 ENCOUNTER — Encounter: Payer: Self-pay | Admitting: General Surgery

## 2015-12-05 ENCOUNTER — Ambulatory Visit (INDEPENDENT_AMBULATORY_CARE_PROVIDER_SITE_OTHER): Payer: Commercial Managed Care - HMO | Admitting: General Surgery

## 2015-12-05 VITALS — BP 120/84 | HR 80 | Resp 14 | Ht 71.0 in | Wt 242.0 lb

## 2015-12-05 DIAGNOSIS — K921 Melena: Secondary | ICD-10-CM | POA: Diagnosis not present

## 2015-12-05 LAB — POC HEMOCCULT BLD/STL (OFFICE/1-CARD/DIAGNOSTIC): FECAL OCCULT BLD: NEGATIVE

## 2015-12-05 NOTE — Progress Notes (Signed)
Patient ID: Douglas Murray, male   DOB: [birth year], 45 y.o.   MRN: [id number]  Chief Complaint  Patient presents with  . Other    blood in stool     HPI Douglas Murray is a 45 y.o. male here today for a evaluation of blood in stool. Patient states he noticed this about two weeks ago. He states it was bright red blood in the bowel and on the paper, plus reported that it was mixed in his stool. His last colonoscopy was done on 10/12/13.  The patient reports he had been straining at stool prior to the recent episode of bleeding.  The patient reports the last 6-12 months that stool has not been round, rather having a flattened portion suggesting something obstructing passage. It is during this time that he is noted an increased need to strain at stool.  I personally reviewed the patient's history. HPI  Past Medical History  Diagnosis Date  . Colon polyp 2010  . Cholelithiasis 2001    Identified on ultrasound. Asymptomatic.  Marland Kitchen Allergy     Past Surgical History  Procedure Laterality Date  . Colonoscopy  10/11/13    Dr Bary Castilla  . Wisdom tooth extraction  1996    Family History  Problem Relation Age of Onset  . Diabetes Mother   . Cancer Maternal Aunt     Social History Social History  Substance Use Topics  . Smoking status: Never Smoker   . Smokeless tobacco: Never Used  . Alcohol Use: No    No Known Allergies  Current Outpatient Prescriptions  Medication Sig Dispense Refill  . atenolol-chlorthalidone (TENORETIC) 50-25 MG per tablet Take 1 tablet by mouth daily.    . metFORMIN (GLUCOPHAGE-XR) 500 MG 24 hr tablet Take 1 tablet (500 mg total) by mouth daily with breakfast. 30 tablet 3  . naproxen (NAPROSYN) 500 MG tablet Take 1 tablet (500 mg total) by mouth 2 (two) times daily with a meal. 30 tablet 0  . pravastatin (PRAVACHOL) 20 MG tablet TAKE 1 TABLET DAILY 30 tablet 3   No current facility-administered medications for this visit.    Review of Systems Review  of Systems  Constitutional: Negative.   Respiratory: Negative.   Cardiovascular: Negative.     Blood pressure 120/84, pulse 80, resp. rate 14, height 5\' 11"  (1.803 m), weight 242 lb (109.77 kg).  Physical Exam Physical Exam  Constitutional: He is oriented to person, place, and time. He appears well-developed and well-nourished.  Eyes: Conjunctivae are normal. No scleral icterus.  Neck: Neck supple.  Cardiovascular: Normal rate, regular rhythm and normal heart sounds.   Pulmonary/Chest: Effort normal and breath sounds normal.  Abdominal: Soft. Normal appearance and bowel sounds are normal. There is no hepatomegaly. There is no tenderness. No hernia.  Genitourinary: Prostate normal. Rectal exam shows internal hemorrhoid. Rectal exam shows no external hemorrhoid, no fissure, no mass, no tenderness and anal tone normal. Guaiac negative stool.  Anoscopy showed mildly prominent internal hemorrhoids without active bleeding. Visualized lower rectal mucosa was unremarkable.  Lymphadenopathy:    He has no cervical adenopathy.  Neurological: He is alert and oriented to person, place, and time.  Skin: Skin is warm and dry.    Data Reviewed 2015 colonoscopy report  Assessment    Rectal bleeding, likely anorectal source.    Plan    With a negative colonoscopy (hyperplastic polyps)2 years ago, it's highly unlikely that there is a true pathologic process. With report of a change in  the stool shape, increased straining in the recent episode of bleeding rigid sigmoidoscopy is been recommended.    Patient to have a rigid sigmoidectomy.  The patient will make use of a fleets enema 1 prior to the procedure.   PCP:  Caryn Section This information has been scribed by Gaspar Cola CMA.   Robert Bellow 12/06/2015, 9:01 PM

## 2015-12-05 NOTE — Patient Instructions (Signed)
Patient to have a ridge sigmoidectomy

## 2015-12-06 ENCOUNTER — Encounter: Payer: Self-pay | Admitting: General Surgery

## 2015-12-06 DIAGNOSIS — K921 Melena: Secondary | ICD-10-CM | POA: Insufficient documentation

## 2015-12-25 ENCOUNTER — Encounter: Payer: Self-pay | Admitting: *Deleted

## 2016-01-01 ENCOUNTER — Ambulatory Visit (INDEPENDENT_AMBULATORY_CARE_PROVIDER_SITE_OTHER): Payer: Commercial Managed Care - HMO | Admitting: General Surgery

## 2016-01-01 ENCOUNTER — Encounter: Payer: Self-pay | Admitting: General Surgery

## 2016-01-01 VITALS — BP 140/98 | HR 80 | Resp 14 | Ht 71.0 in | Wt 261.0 lb

## 2016-01-01 DIAGNOSIS — K921 Melena: Secondary | ICD-10-CM

## 2016-01-01 DIAGNOSIS — R194 Change in bowel habit: Secondary | ICD-10-CM

## 2016-01-01 NOTE — Progress Notes (Signed)
Patient ID: Douglas Murray, male   DOB: [birth year], 45 y.o.   MRN: [id number]  Chief Complaint  Patient presents with  . Procedure    rigid sigmiioscopy    HPI Douglas Murray is a 45 y.o. male here today for a rigid sigmoidoscopy.The patient had reported a change in the caliber and shape of his stools when he was last evaluated for rectal bleeding. Anoscopy at that time was negative. He reports no further difficulty with bleeding since his last visit, and reports that his bowel movements have returned to a normal shape (round).  I personally reviewed the patient's history. HPI  Past Medical History  Diagnosis Date  . Colon polyp 2010  . Cholelithiasis 2001    Identified on ultrasound. Asymptomatic.  Marland Kitchen Allergy     Past Surgical History  Procedure Laterality Date  . Colonoscopy  10/11/13    Dr Bary Castilla  . Wisdom tooth extraction  1996    Family History  Problem Relation Age of Onset  . Diabetes Mother   . Cancer Maternal Aunt     Social History Social History  Substance Use Topics  . Smoking status: Never Smoker   . Smokeless tobacco: Never Used  . Alcohol Use: No    No Known Allergies  Current Outpatient Prescriptions  Medication Sig Dispense Refill  . atenolol-chlorthalidone (TENORETIC) 50-25 MG per tablet Take 1 tablet by mouth daily.    . metFORMIN (GLUCOPHAGE-XR) 500 MG 24 hr tablet Take 1 tablet (500 mg total) by mouth daily with breakfast. 30 tablet 3  . naproxen (NAPROSYN) 500 MG tablet Take 1 tablet (500 mg total) by mouth 2 (two) times daily with a meal. 30 tablet 0  . pravastatin (PRAVACHOL) 20 MG tablet TAKE 1 TABLET DAILY 30 tablet 3   No current facility-administered medications for this visit.    Review of Systems Review of Systems  Constitutional: Negative.   Respiratory: Negative.   Cardiovascular: Negative.     Blood pressure 140/98, pulse 80, resp. rate 14, height 5\' 11"  (1.803 m), weight 261 lb (118.389 kg).  Physical Exam Physical  Exam  Constitutional: He is oriented to person, place, and time. He appears well-developed and well-nourished.  Eyes: No scleral icterus.  Abdominal: Soft. There is no tenderness.  Genitourinary:     Lymphadenopathy:    He has no cervical adenopathy.  Neurological: He is alert and oriented to person, place, and time.  Skin: Skin is warm.    Data Reviewed The patient had prepared himself with a Fleet's enema 1 (administered by his wife). Preparation was excellent. The rigid sigmoidoscope was advanced to 25 cm without discomfort. No mucosal lesions noted. No hemorrhoids identified. Normal exam.  Assessment    Normal anorectal exam, resolution of previously reported rectal bleeding.    Plan    The patient will notify the office if he has further problems. He has been encouraged to make use of a fiber supplement on a daily basis to minimize straining at stool. Hand written instructions were provided regarding the use of Metamucil/Citrucel/FiberCon/ or Meta wafers.  The importance of adequate liquid with the fiber supplement was reviewed.     PCP:  Caryn Section This information has been scribed by Gaspar Cola CMA.   Robert Bellow 01/01/2016, 8:34 PM

## 2016-01-16 ENCOUNTER — Other Ambulatory Visit: Payer: Self-pay | Admitting: Family Medicine

## 2016-02-05 ENCOUNTER — Other Ambulatory Visit: Payer: Self-pay | Admitting: Family Medicine

## 2016-02-09 ENCOUNTER — Ambulatory Visit: Payer: 59 | Admitting: Family Medicine

## 2016-02-27 ENCOUNTER — Ambulatory Visit (INDEPENDENT_AMBULATORY_CARE_PROVIDER_SITE_OTHER): Payer: Commercial Managed Care - HMO | Admitting: Family Medicine

## 2016-02-27 ENCOUNTER — Encounter: Payer: Self-pay | Admitting: Family Medicine

## 2016-02-27 VITALS — BP 118/80 | HR 62 | Temp 98.5°F | Resp 16 | Ht 71.0 in | Wt 266.0 lb

## 2016-02-27 DIAGNOSIS — E881 Lipodystrophy, not elsewhere classified: Secondary | ICD-10-CM

## 2016-02-27 DIAGNOSIS — I10 Essential (primary) hypertension: Secondary | ICD-10-CM

## 2016-02-27 DIAGNOSIS — E119 Type 2 diabetes mellitus without complications: Secondary | ICD-10-CM | POA: Diagnosis not present

## 2016-02-27 LAB — POCT GLYCOSYLATED HEMOGLOBIN (HGB A1C)
Est. average glucose Bld gHb Est-mCnc: 220
HEMOGLOBIN A1C: 9.3

## 2016-02-27 MED ORDER — METFORMIN HCL ER 500 MG PO TB24: 1000.0000 mg | ORAL_TABLET | Freq: Every day | ORAL | 4 refills | Status: DC

## 2016-02-27 NOTE — Progress Notes (Signed)
Patient: Douglas Murray Male    DOB: [birth year]   45 y.o.   MRN: [id number] Visit Date: 02/27/2016  Today's Provider: Lelon Huh, MD   Chief Complaint  Patient presents with  . Follow-up  . Diabetes  . Hypertension   Subjective:    HPI   Diabetes Mellitus Type II, Follow-up:   Lab Results  Component Value Date   HGBA1C 8.0 10/10/2015   HGBA1C 7.9 07/10/2015   HGBA1C 7.1 03/13/2015   Last seen for diabetes 5 months ago.  Management since then includes;he is to work on working up to 176minutes CV exercise a week and cut back on carbs.    . He reports good compliance with treatment. He is not having side effects. none Current symptoms include none and have been unchanged. Home blood sugar records: fasting range: 130  Episodes of hypoglycemia? no   Current Insulin Regimen: n/a Most Recent Eye Exam: due Weight trend: stable Prior visit with dietician: no Current diet: well balanced Current exercise: gym  ----------------------------------------------------------------   Hypertension, follow-up:  BP Readings from Last 3 Encounters:  02/27/16 118/80  01/01/16 (!) 140/98  12/05/15 120/84    He was last seen for hypertension 5 months ago.  BP at that visit was 116/70. Management since that visit includes; no changes.He reports good compliance with treatment. He is not having side effects. none He is exercising. He is not adherent to low salt diet.   Outside blood pressures are n/a. He is experiencing none.  Patient denies none.   Cardiovascular risk factors include diabetes mellitus.  Use of agents associated with hypertension: none.   ----------------------------------------------------------------  Has going to gym about 3 times a week for   No Known Allergies Current Meds  Medication Sig  . atenolol-chlorthalidone (TENORETIC) 50-25 MG tablet TAKE 1 TABLET DAILY  . metFORMIN (GLUCOPHAGE-XR) 500 MG 24 hr tablet Take 1 tablet (500 mg total)  by mouth daily with breakfast.  . naproxen (NAPROSYN) 500 MG tablet Take 1 tablet (500 mg total) by mouth 2 (two) times daily with a meal.  . pravastatin (PRAVACHOL) 20 MG tablet TAKE 1 TABLET DAILY    Review of Systems  Constitutional: Negative for appetite change, chills and fever.  Respiratory: Negative for chest tightness, shortness of breath and wheezing.   Cardiovascular: Negative for chest pain and palpitations.  Gastrointestinal: Negative for abdominal pain, nausea and vomiting.    Social History  Substance Use Topics  . Smoking status: Never Smoker  . Smokeless tobacco: Never Used  . Alcohol use No   Objective:   BP 118/80 (BP Location: Right Arm, Patient Position: Sitting, Cuff Size: Large)   Pulse 62   Temp 98.5 F (36.9 C) (Oral)   Resp 16   Ht 5\' 11"  (1.803 m)   Wt 266 lb (120.7 kg)   SpO2 95%   BMI 37.10 kg/m   Physical Exam  General Appearance:    Alert, cooperative, no distress, obese  Eyes:    PERRL, conjunctiva/corneas clear, EOM's intact       Lungs:     Clear to auscultation bilaterally, respirations unlabored  Heart:    Regular rate and rhythm  Neurologic:   Awake, alert, oriented x 3. No apparent focal neurological           defect.         Results for orders placed or performed in visit on 02/27/16  POCT glycosylated hemoglobin (Hb A1C)  Result Value  Ref Range   Hemoglobin A1C 9.3    Est. average glucose Bld gHb Est-mCnc 220        Assessment & Plan:     1. Type 2 diabetes mellitus without complication, without long-term current use of insulin (HCC) Uncontrolled, will double dose of metformin ER and counseled in improvements in diety habits - POCT glycosylated hemoglobin (Hb A1C) - metFORMIN (GLUCOPHAGE-XR) 500 MG 24 hr tablet; Take 2 tablets (1,000 mg total) by mouth daily with breakfast.  Dispense: 30 tablet; Refill: 4  2. Essential (primary) hypertension Well controlled.   Current Outpatient Prescriptions  Medication Sig Dispense  Refill  . atenolol-chlorthalidone (TENORETIC) 50-25 MG tablet TAKE 1 TABLET DAILY 90 tablet 4  . metFORMIN (GLUCOPHAGE-XR) 500 MG 24 hr tablet Take 2 tablets (1,000 mg total) by mouth daily with breakfast. 30 tablet 4  . naproxen (NAPROSYN) 500 MG tablet Take 1 tablet (500 mg total) by mouth 2 (two) times daily with a meal. 30 tablet 0  . pravastatin (PRAVACHOL) 20 MG tablet TAKE 1 TABLET DAILY 30 tablet 2   No current facility-administered medications for this visit.     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   - Lipid panel - Hepatic function panel  Return in about 3 months (around 05/29/2016).     The entirety of the information documented in the History of Present Illness, Review of Systems and Physical Exam were personally obtained by me. Portions of this information were initially documented by April M. Sabra Heck, CMA and reviewed by me for thoroughness and accuracy.    Lelon Huh, MD  [facility]

## 2016-02-28 LAB — RENAL FUNCTION PANEL
Albumin: 4.4 g/dL (ref 3.5–5.5)
BUN / CREAT RATIO: 9 (ref 9–20)
BUN: 10 mg/dL (ref 6–24)
CO2: 27 mmol/L (ref 18–29)
CREATININE: 1.14 mg/dL (ref 0.76–1.27)
Calcium: 9.8 mg/dL (ref 8.7–10.2)
Chloride: 100 mmol/L (ref 96–106)
GFR calc non Af Amer: 78 mL/min/{1.73_m2} (ref >59)
GFR, EST AFRICAN AMERICAN: 91 mL/min/{1.73_m2} (ref >59)
Glucose: 169 mg/dL — ABNORMAL HIGH (ref 65–99)
Phosphorus: 4.7 mg/dL — ABNORMAL HIGH (ref 2.5–4.5)
Potassium: 3.7 mmol/L (ref 3.5–5.2)
SODIUM: 143 mmol/L (ref 134–144)

## 2016-02-28 LAB — HEPATIC FUNCTION PANEL
ALK PHOS: 73 IU/L (ref 39–117)
ALT: 46 IU/L — AB (ref 0–44)
AST: 26 IU/L (ref 0–40)
Bilirubin Total: 0.4 mg/dL (ref 0.0–1.2)
Bilirubin, Direct: 0.13 mg/dL (ref 0.00–0.40)
Total Protein: 7.1 g/dL (ref 6.0–8.5)

## 2016-02-28 LAB — LIPID PANEL
CHOL/HDL RATIO: 7.8 ratio — AB (ref 0.0–5.0)
Cholesterol, Total: 163 mg/dL (ref 100–199)
HDL: 21 mg/dL — ABNORMAL LOW (ref >39)
LDL CALC: 82 mg/dL (ref 0–99)
TRIGLYCERIDES: 300 mg/dL — AB (ref 0–149)
VLDL CHOLESTEROL CAL: 60 mg/dL — AB (ref 5–40)

## 2016-03-01 ENCOUNTER — Telehealth: Payer: Self-pay

## 2016-03-01 MED ORDER — PRAVASTATIN SODIUM 40 MG PO TABS: 40.0000 mg | ORAL_TABLET | Freq: Every day | ORAL | 5 refills | Status: DC

## 2016-03-01 NOTE — Telephone Encounter (Signed)
-----   Message from Birdie Sons, MD sent at 02/28/2016  7:58 AM EDT ----- Triglycerides high at 300. LDL cholesterol is 82 and needs to be under 70. Increase pravastatin to 40mg  daily #30, rf x 5. Continue all other meds.  medications.  Follow up for diabetes in November as scheduled.

## 2016-03-01 NOTE — Telephone Encounter (Signed)
Pt advised.  Rx sent to [facility] [place].   Thanks,   -Mickel Baas

## 2016-04-09 ENCOUNTER — Encounter: Payer: Self-pay | Admitting: Family Medicine

## 2016-04-09 ENCOUNTER — Ambulatory Visit (INDEPENDENT_AMBULATORY_CARE_PROVIDER_SITE_OTHER): Payer: Commercial Managed Care - HMO | Admitting: Family Medicine

## 2016-04-09 ENCOUNTER — Telehealth: Payer: Self-pay | Admitting: Family Medicine

## 2016-04-09 VITALS — BP 120/84 | HR 64 | Temp 98.5°F | Resp 16 | Wt 261.0 lb

## 2016-04-09 DIAGNOSIS — L600 Ingrowing nail: Secondary | ICD-10-CM | POA: Diagnosis not present

## 2016-04-09 MED ORDER — AMOXICILLIN-POT CLAVULANATE 875-125 MG PO TABS: 1.0000 | ORAL_TABLET | Freq: Two times a day (BID) | ORAL | 0 refills | Status: AC

## 2016-04-09 NOTE — Patient Instructions (Signed)
Ingrown Toenail  An ingrown toenail occurs when the corner or sides of your toenail grow into the surrounding skin. The big toe is most commonly affected, but it can happen to any of your toes. If your ingrown toenail is not treated, you will be at risk for infection.  CAUSES  This condition may be caused by:  · Wearing shoes that are too small or tight.  · Injury or trauma, such as stubbing your toe or having your toe stepped on.  · Improper cutting or care of your toenails.  · Being born with (congenital) nail or foot abnormalities, such as having a nail that is too big for your toe.  RISK FACTORS  Risk factors for an ingrown toenail include:  · Age. Your nails tend to thicken as you get older, so ingrown nails are more common in older people.  · Diabetes.  · Cutting your toenails incorrectly.  · Blood circulation problems.  SYMPTOMS  Symptoms may include:  · Pain, soreness, or tenderness.  · Redness.  · Swelling.  · Hardening of the skin surrounding the toe.  Your ingrown toenail may be infected if there is fluid, pus, or drainage.  DIAGNOSIS   An ingrown toenail may be diagnosed by medical history and physical exam. If your toenail is infected, your health care provider may test a sample of the drainage.  TREATMENT  Treatment depends on the severity of your ingrown toenail. Some ingrown toenails may be treated at home. More severe or infected ingrown toenails may require surgery to remove all or part of the nail. Infected ingrown toenails may also be treated with antibiotic medicines.  HOME CARE INSTRUCTIONS  · If you were prescribed an antibiotic medicine, finish all of it even if you start to feel better.  · Soak your foot in warm soapy water for 20 minutes, 3 times per day or as directed by your health care provider.  · Carefully lift the edge of the nail away from the sore skin by wedging a small piece of cotton under the corner of the nail. This may help with the pain.  Be careful not to cause more injury  to the area.  · Wear shoes that fit well. If your ingrown toenail is causing you pain, try wearing sandals, if possible.  · Trim your toenails regularly and carefully. Do not cut them in a curved shape. Cut your toenails straight across. This prevents injury to the skin at the corners of the toenail.  · Keep your feet clean and dry.  · If you are having trouble walking and are given crutches by your health care provider, use them as directed.  · Do not pick at your toenail or try to remove it yourself.  · Take medicines only as directed by your health care provider.  · Keep all follow-up visits as directed by your health care provider. This is important.  SEEK MEDICAL CARE IF:  · Your symptoms do not improve with treatment.  SEEK IMMEDIATE MEDICAL CARE IF:  · You have red streaks that start at your foot and go up your leg.  · You have a fever.  · You have increased redness, swelling, or pain.  · You have fluid, blood, or pus coming from your toenail.     This information is not intended to replace advice given to you by your health care provider. Make sure you discuss any questions you have with your health care provider.     Document Released:   07/05/2000 Document Revised: 11/22/2014 Document Reviewed: 06/01/2014  Elsevier Interactive Patient Education ©2016 [employer]

## 2016-04-09 NOTE — Telephone Encounter (Signed)
He can come at 2

## 2016-04-09 NOTE — Telephone Encounter (Signed)
Please advise 

## 2016-04-09 NOTE — Telephone Encounter (Signed)
Called pt to offer an appointment.  Left message/MW

## 2016-04-09 NOTE — Telephone Encounter (Signed)
Pt appointment scheduled for today/MW

## 2016-04-09 NOTE — Progress Notes (Signed)
       Patient: Douglas Murray Male    DOB: Dec 17, 1970   45 y.o.   MRN: [id number] Visit Date: 04/09/2016  Today's Provider: Lelon Huh, MD   Chief Complaint  Patient presents with  . Toe Pain   Subjective:    Patient stated that his left big toe is swollen, red, & sore. Left big toe had redness at the cuticle last week. Patient said that toe looked like it was infected so he boiled it out with peroxide. Patient stated that swelling has gone down some but his toe is still tender, swollen and red. Also patient stated that he has had a numb feeling in th second and third toes on his left foot for the past 3 weeks.   Toe Pain   The incident occurred more than 1 week ago (last week). There was no injury mechanism. Pain location: left big toe. The quality of the pain is described as burning. The pain is at a severity of 3/10. The pain is mild. The pain has been constant since onset. He reports no foreign bodies present. The symptoms are aggravated by palpation. Treatments tried: peroxide. The treatment provided no relief.      No Known Allergies   Current Outpatient Prescriptions:  .  atenolol-chlorthalidone (TENORETIC) 50-25 MG tablet, TAKE 1 TABLET DAILY, Disp: 90 tablet, Rfl: 4 .  metFORMIN (GLUCOPHAGE-XR) 500 MG 24 hr tablet, Take 2 tablets (1,000 mg total) by mouth daily with breakfast., Disp: 30 tablet, Rfl: 4 .  pravastatin (PRAVACHOL) 40 MG tablet, Take 1 tablet (40 mg total) by mouth daily., Disp: 30 tablet, Rfl: 5  Review of Systems  Constitutional: Negative for appetite change, chills and fever.  Respiratory: Negative for chest tightness, shortness of breath and wheezing.   Cardiovascular: Negative for chest pain and palpitations.  Gastrointestinal: Negative for abdominal pain, nausea and vomiting.    Social History  Substance Use Topics  . Smoking status: Never Smoker  . Smokeless tobacco: Never Used  . Alcohol use No   Objective:   BP 120/84 (BP Location:  Left Arm, Patient Position: Sitting, Cuff Size: Large)   Pulse 64   Temp 98.5 F (36.9 C) (Oral)   Resp 16   Wt 261 lb (118.4 kg)   SpO2 98%   BMI 36.40 kg/m   Physical Exam  General appearance: alert, well developed, well nourished, cooperative and in no distress Head: Normocephalic, without obvious abnormality, atraumatic Respiratory: Respirations even and unlabored, normal respiratory rate Extremities: ingrown nail on right great toe with moderate redness and swelling around nail. No current drainage.   .     Assessment & Plan:     1. Ingrown toenail Instructions for soaking toes one to two times daily. Call if symptoms change or if not rapidly improving.    - amoxicillin-clavulanate (AUGMENTIN) 875-125 MG tablet; Take 1 tablet by mouth 2 (two) times daily.  Dispense: 20 tablet; Refill: 0       Lelon Huh, MD  [facility]

## 2016-04-09 NOTE — Telephone Encounter (Signed)
Pt stated that he is diabetic and his left big toe is swollen, red, & sore. Pt would like to come in to see Dr. Caryn Section. Can pt be worked in today or tomorrow? Please advise. Thanks TNP

## 2016-05-15 LAB — HM DIABETES EYE EXAM

## 2016-05-29 ENCOUNTER — Ambulatory Visit: Payer: Commercial Managed Care - HMO | Admitting: Family Medicine

## 2016-06-03 ENCOUNTER — Encounter: Payer: Self-pay | Admitting: *Deleted

## 2016-07-24 ENCOUNTER — Ambulatory Visit: Payer: Commercial Managed Care - HMO | Admitting: Family Medicine

## 2016-07-30 ENCOUNTER — Ambulatory Visit (INDEPENDENT_AMBULATORY_CARE_PROVIDER_SITE_OTHER): Payer: Commercial Managed Care - HMO | Admitting: Family Medicine

## 2016-07-30 ENCOUNTER — Encounter: Payer: Self-pay | Admitting: Family Medicine

## 2016-07-30 VITALS — BP 134/82 | HR 68 | Temp 98.8°F | Resp 16 | Wt 257.0 lb

## 2016-07-30 DIAGNOSIS — E118 Type 2 diabetes mellitus with unspecified complications: Secondary | ICD-10-CM | POA: Diagnosis not present

## 2016-07-30 DIAGNOSIS — E119 Type 2 diabetes mellitus without complications: Secondary | ICD-10-CM | POA: Diagnosis not present

## 2016-07-30 DIAGNOSIS — E1165 Type 2 diabetes mellitus with hyperglycemia: Secondary | ICD-10-CM

## 2016-07-30 DIAGNOSIS — IMO0002 Reserved for concepts with insufficient information to code with codable children: Secondary | ICD-10-CM

## 2016-07-30 DIAGNOSIS — I10 Essential (primary) hypertension: Secondary | ICD-10-CM | POA: Diagnosis not present

## 2016-07-30 LAB — POCT GLYCOSYLATED HEMOGLOBIN (HGB A1C)
Est. average glucose Bld gHb Est-mCnc: 301
Hemoglobin A1C: 12.1

## 2016-07-30 MED ORDER — CANAGLIFLOZIN 100 MG PO TABS: 100.0000 mg | ORAL_TABLET | Freq: Every day | ORAL | 0 refills | Status: DC

## 2016-07-30 NOTE — Progress Notes (Signed)
Patient: Douglas Murray Male    DOB: 06/26/1971   46 y.o.   MRN: [id number] Visit Date: 07/30/2016  Today's Provider: Lelon Huh, MD   Chief Complaint  Patient presents with  . Diabetes  . Hypertension  . Hyperlipidemia   Subjective:    HPI  Diabetes Mellitus Type II, Follow-up:   Lab Results  Component Value Date   HGBA1C 12.1 07/30/2016   HGBA1C 9.3 02/27/2016   HGBA1C 8.0 10/10/2015    Last seen for diabetes 4 months ago.  Management since then includes doubling the dose of Metformin (1000mg  total). He reports good compliance with treatment. He is not having side effects.  Current symptoms include none and have been stable. Home blood sugar records: trend: fluctuating a bit  Episodes of hypoglycemia? no   Current Insulin Regimen: none Most Recent Eye Exam: last year Weight trend: stable Prior visit with dietician: no Current diet: well balanced Current exercise: none  Pertinent Labs:    Component Value Date/Time   CHOL 163 02/27/2016 1657   TRIG 300 (H) 02/27/2016 1657   HDL 21 (L) 02/27/2016 1657   LDLCALC 82 02/27/2016 1657   CREATININE 1.14 02/27/2016 1657    Wt Readings from Last 3 Encounters:  07/30/16 257 lb (116.6 kg)  04/09/16 261 lb (118.4 kg)  02/27/16 266 lb (120.7 kg)      Hypertension, follow-up:  BP Readings from Last 3 Encounters:  07/30/16 134/82  04/09/16 120/84  02/27/16 118/80    He was last seen for hypertension 3 months ago.  BP at that visit was 118/80. Management since that visit includes no change. He reports good compliance with treatment. He is not having side effects.  He is not exercising. He is adherent to low salt diet.   Outside blood pressures are not being checked. Patient denies chest pressure/discomfort, exertional chest pressure/discomfort, irregular heart beat, lower extremity edema and syncope.   Cardiovascular risk factors include diabetes mellitus and dyslipidemia.     Weight trend:  stable Wt Readings from Last 3 Encounters:  07/30/16 257 lb (116.6 kg)  04/09/16 261 lb (118.4 kg)  02/27/16 266 lb (120.7 kg)    Current diet: well balanced    Lipid/Cholesterol, Follow-up:   Last seen for this4 months ago.  Management changes since that visit include increased pravastatin to 40mg  daily. . Last Lipid Panel:    Component Value Date/Time   CHOL 163 02/27/2016 1657   TRIG 300 (H) 02/27/2016 1657   HDL 21 (L) 02/27/2016 1657   CHOLHDL 7.8 (H) 02/27/2016 1657   LDLCALC 82 02/27/2016 1657    Risk factors for vascular disease include diabetes mellitus and hypertension  He reports good compliance with treatment. He is not having side effects.  Current symptoms include none and have been stable. Weight trend: stable Prior visit with dietician: no Current diet: well balanced Current exercise: none  Wt Readings from Last 3 Encounters:  07/30/16 257 lb (116.6 kg)  04/09/16 261 lb (118.4 kg)  02/27/16 266 lb (120.7 kg)           No Known Allergies   Current Outpatient Prescriptions:  .  atenolol-chlorthalidone (TENORETIC) 50-25 MG tablet, TAKE 1 TABLET DAILY, Disp: 90 tablet, Rfl: 4 .  metFORMIN (GLUCOPHAGE-XR) 500 MG 24 hr tablet, Take 2 tablets (1,000 mg total) by mouth daily with breakfast., Disp: 30 tablet, Rfl: 4 .  pravastatin (PRAVACHOL) 40 MG tablet, Take 1 tablet (40 mg total) by mouth  daily., Disp: 30 tablet, Rfl: 5  Review of Systems  Constitutional: Negative.   Respiratory: Negative.   Cardiovascular: Negative.   Endocrine: Negative.   Musculoskeletal: Negative.   Neurological: Negative.     Social History  Substance Use Topics  . Smoking status: Never Smoker  . Smokeless tobacco: Never Used  . Alcohol use No   Objective:   BP 134/82 (BP Location: Right Arm, Patient Position: Sitting, Cuff Size: Large)   Pulse 68   Temp 98.8 F (37.1 C)   Resp 16   Wt 257 lb (116.6 kg)   BMI 35.84 kg/m   Physical Exam  General  Appearance:    Alert, cooperative, no distress, obese  Eyes:    PERRL, conjunctiva/corneas clear, EOM's intact       Lungs:     Clear to auscultation bilaterally, respirations unlabored  Heart:    Regular rate and rhythm  Neurologic:   Awake, alert, oriented x 3. No apparent focal neurological           defect.       Results for orders placed or performed in visit on 07/30/16  POCT glycosylated hemoglobin (Hb A1C)  Result Value Ref Range   Hemoglobin A1C 12.1    Est. average glucose Bld gHb Est-mCnc 301        Assessment & Plan:     1. Essential (primary) hypertension Well controlled.  Continue current medications.    2. Type 2 diabetes mellitus without complication, without long-term current use of insulin (HCC)  - POCT glycosylated hemoglobin (Hb A1C)  3. Uncontrolled type 2 diabetes mellitus with complication, without long-term current use of insulin ([place]) Add Invokana. Follow up in 3-4 weeks.  - canagliflozin (INVOKANA) 100 MG TABS tablet; Take 1 tablet (100 mg total) by mouth daily before breakfast.  Dispense: 30 tablet; Refill: 0       Lelon Huh, MD  [facility]

## 2016-08-20 ENCOUNTER — Encounter: Payer: Self-pay | Admitting: Family Medicine

## 2016-08-20 ENCOUNTER — Ambulatory Visit (INDEPENDENT_AMBULATORY_CARE_PROVIDER_SITE_OTHER): Payer: Commercial Managed Care - HMO | Admitting: Family Medicine

## 2016-08-20 VITALS — BP 120/70 | HR 65 | Temp 98.5°F | Resp 16 | Ht 71.0 in | Wt 252.0 lb

## 2016-08-20 DIAGNOSIS — E119 Type 2 diabetes mellitus without complications: Secondary | ICD-10-CM | POA: Diagnosis not present

## 2016-08-20 NOTE — Progress Notes (Signed)
Patient: Douglas Murray Male    DOB: [birth year]   46 y.o.   MRN: [id number] Visit Date: 08/20/2016  Today's Provider: Lelon Huh, MD   Chief Complaint  Patient presents with  . Follow-up  . Diabetes   Subjective:    HPI   Diabetes Mellitus Type II, Follow-up:   Lab Results  Component Value Date   HGBA1C 12.1 07/30/2016   HGBA1C 9.3 02/27/2016   HGBA1C 8.0 10/10/2015   Last seen for diabetes 3 weeks ago.  Management since then includes;Added Invokana. Follow up in 3-4 weeks . He reports good compliance with treatment. He is not having side effects, although he has had a little dizziness since starting medication.  Current symptoms include none and have been unchanged. Home blood sugar records: fasting range: 170  Episodes of hypoglycemia? no   Current Insulin Regimen: n/a Most Recent Eye Exam: last year Weight trend: stable Prior visit with dietician: no Current diet: in general, a "healthy" diet   Current exercise: gym  BP Readings from Last 3 Encounters:  08/20/16 120/70  07/30/16 134/82  04/09/16 120/84   Wt Readings from Last 3 Encounters:  08/20/16 252 lb (114.3 kg)  07/30/16 257 lb (116.6 kg)  04/09/16 261 lb (118.4 kg)    ----------------------------------------------------------------    No Known Allergies   Current Outpatient Prescriptions:  .  atenolol-chlorthalidone (TENORETIC) 50-25 MG tablet, TAKE 1 TABLET DAILY, Disp: 90 tablet, Rfl: 4 .  canagliflozin (INVOKANA) 100 MG TABS tablet, Take 1 tablet (100 mg total) by mouth daily before breakfast., Disp: 30 tablet, Rfl: 0 .  metFORMIN (GLUCOPHAGE-XR) 500 MG 24 hr tablet, Take 2 tablets (1,000 mg total) by mouth daily with breakfast. (Patient taking differently: Take 500 mg by mouth daily with breakfast. ), Disp: 30 tablet, Rfl: 4 .  pravastatin (PRAVACHOL) 40 MG tablet, Take 1 tablet (40 mg total) by mouth daily., Disp: 30 tablet, Rfl: 5  Review of Systems  Constitutional:  Negative for appetite change, chills and fever.  Respiratory: Negative for chest tightness, shortness of breath and wheezing.   Cardiovascular: Negative for chest pain and palpitations.  Gastrointestinal: Negative for abdominal pain, nausea and vomiting.    Social History  Substance Use Topics  . Smoking status: Never Smoker  . Smokeless tobacco: Never Used  . Alcohol use No   Objective:   BP 120/70 (BP Location: Right Arm, Patient Position: Sitting, Cuff Size: Large)   Pulse 65   Temp 98.5 F (36.9 C) (Oral)   Resp 16   Ht 5\' 11"  (1.803 m)   Wt 252 lb (114.3 kg)   SpO2 97%   BMI 35.15 kg/m   Physical Exam   General Appearance:    Alert, cooperative, no distress  Eyes:    PERRL, conjunctiva/corneas clear, EOM's intact       Lungs:     Clear to auscultation bilaterally, respirations unlabored  Heart:    Regular rate and rhythm  Neurologic:   Awake, alert, oriented x 3. No apparent focal neurological           defect.           Assessment & Plan:     1. Type 2 diabetes mellitus without complication, without long-term current use of insulin ([place]) Doing well with recent addition of Invokana, although have occasional dizziness. If electrolytes look good will send in rx for 100mg  Invokana and check A1c in 2 months.  - Renal function panel  The entirety of the information documented in the History of Present Illness, Review of Systems and Physical Exam were personally obtained by me. Portions of this information were initially documented by April M. Sabra Heck, CMA and reviewed by me for thoroughness and accuracy.        Lelon Huh, MD  [facility]

## 2016-08-21 ENCOUNTER — Other Ambulatory Visit: Payer: Self-pay | Admitting: Family Medicine

## 2016-08-21 DIAGNOSIS — E1165 Type 2 diabetes mellitus with hyperglycemia: Secondary | ICD-10-CM

## 2016-08-21 DIAGNOSIS — IMO0002 Reserved for concepts with insufficient information to code with codable children: Secondary | ICD-10-CM

## 2016-08-21 DIAGNOSIS — E118 Type 2 diabetes mellitus with unspecified complications: Principal | ICD-10-CM

## 2016-08-21 LAB — RENAL FUNCTION PANEL
ALBUMIN: 4.6 g/dL (ref 3.5–5.5)
BUN/Creatinine Ratio: 9 (ref 9–20)
BUN: 13 mg/dL (ref 6–24)
CO2: 29 mmol/L (ref 18–29)
Calcium: 10 mg/dL (ref 8.7–10.2)
Chloride: 96 mmol/L (ref 96–106)
Creatinine, Ser: 1.45 mg/dL — ABNORMAL HIGH (ref 0.76–1.27)
GFR calc Af Amer: 67 mL/min/{1.73_m2} (ref >59)
GFR, EST NON AFRICAN AMERICAN: 58 mL/min/{1.73_m2} — AB (ref >59)
GLUCOSE: 212 mg/dL — AB (ref 65–99)
PHOSPHORUS: 4.6 mg/dL — AB (ref 2.5–4.5)
POTASSIUM: 3.2 mmol/L — AB (ref 3.5–5.2)
Sodium: 144 mmol/L (ref 134–144)

## 2016-08-21 MED ORDER — CANAGLIFLOZIN 100 MG PO TABS: 100.0000 mg | ORAL_TABLET | Freq: Every day | ORAL | 3 refills | Status: DC

## 2016-08-26 ENCOUNTER — Ambulatory Visit
Admission: EM | Admit: 2016-08-26 | Discharge: 2016-08-26 | Disposition: A | Discharge status: Home / Self Care | Payer: Worker's Compensation | Attending: Family Medicine | Admitting: Family Medicine

## 2016-08-26 ENCOUNTER — Ambulatory Visit (INDEPENDENT_AMBULATORY_CARE_PROVIDER_SITE_OTHER): Payer: Worker's Compensation

## 2016-08-26 DIAGNOSIS — S63502A Unspecified sprain of left wrist, initial encounter: Secondary | ICD-10-CM | POA: Diagnosis not present

## 2016-08-26 DIAGNOSIS — S63509A Unspecified sprain of unspecified wrist, initial encounter: Secondary | ICD-10-CM | POA: Diagnosis not present

## 2016-08-26 DIAGNOSIS — M25512 Pain in left shoulder: Secondary | ICD-10-CM | POA: Diagnosis not present

## 2016-08-26 MED ORDER — MELOXICAM 15 MG PO TABS: 15.0000 mg | ORAL_TABLET | Freq: Every day | ORAL | 0 refills | Status: DC | PRN

## 2016-08-26 NOTE — ED Provider Notes (Signed)
[facility] ____________________________________________  Time seen: Approximately 1230 PM  I have reviewed the triage vital signs and the nursing notes.   HISTORY  Chief Complaint Fall [facility] Shoulder Pain Left)   HPI STILES ALPERS is a 46 y.o. male  presenting for evaluation of left shoulder and left wrist pain post fall. Patient reports fall occurred approximately 1 hour prior to arrival while at work. 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 Patient reports that he fell because he tripped over a palate that was present on the floor. Patient states when he fell he tried to catch himself with his left arm and then landed on his left shoulder. Denies head injury or loss consciousness. Denies other pain or injury. Patient reports he is left-hand dominant.  Patient states pain present to left shoulder and left wrist. States pain is minimal at rest but mild to moderate with active range of motion. Patient states pain to left shoulder is primarily with overhead lifting. Denies decreased range of motion. Denies paresthesias, pain radiation or other injury. Patient reports he has not taken any medications for the same complaints prior to arrival. Reports no previous history with the same areas.   Denies chest pain, shortness of breath, abdominal pain, dysuria, rib pain, neck pain, back pain, other extremity pain, other extremity swelling or rash. Denies recent sickness. Denies recent antibiotic use.  Lelon Huh, MD: PCP   Past Medical History:  Diagnosis Date  . Allergy   . Cholelithiasis 2001   Identified on ultrasound. Asymptomatic.  Marland Kitchen Colon polyp 2010    Patient Active Problem List   Diagnosis Date Noted  . Change in bowel habits 01/01/2016  . Blood in stool 12/06/2015  . Eczema 03/10/2015  . Obesity 03/10/2015  . Obstructive sleep apnea of adult 11/12/2013  . Personal history of colonic polyps 09/14/2013  . Diabetes mellitus, type 2 ([place]) 05/12/2012  .  Diverticulosis of colon without hemorrhage 09/15/2008  . 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 Allergic rhinitis 08/01/2006  . Essential (primary) hypertension 08/01/2006    Past Surgical History:  Procedure Laterality Date  . COLONOSCOPY  10/11/13   Dr Bary Castilla  . WISDOM TOOTH EXTRACTION  1996     No current facility-administered medications for this encounter.   Current Outpatient Prescriptions:  .  atenolol-chlorthalidone (TENORETIC) 50-25 MG tablet, TAKE 1 TABLET DAILY, Disp: 90 tablet, Rfl: 4 .  canagliflozin (INVOKANA) 100 MG TABS tablet, Take 1 tablet (100 mg total) by mouth daily before breakfast., Disp: 30 tablet, Rfl: 3 .  metFORMIN (GLUCOPHAGE-XR) 500 MG 24 hr tablet, Take 2 tablets (1,000 mg total) by mouth daily with breakfast. (Patient taking differently: Take 500 mg by mouth daily with breakfast. ), Disp: 30 tablet, Rfl: 4 .  pravastatin (PRAVACHOL) 40 MG tablet, Take 1 tablet (40 mg total) by mouth daily., Disp: 30 tablet, Rfl: 5 .  meloxicam (MOBIC) 15 MG tablet, Take 1 tablet (15 mg total) by mouth daily as needed for pain., Disp: 10 tablet, Rfl: 0  Allergies Patient has no known allergies.  Family History  Problem Relation Age of Onset  . Diabetes Mother   . Cancer Maternal Aunt     Social History Social History  Substance Use Topics  . Smoking status: Never Smoker  . Smokeless tobacco: Never Used  . Alcohol use No    Review of Systems Constitutional: No fever/chills Eyes: No visual changes. ENT: No sore throat. Cardiovascular: Denies chest pain. Respiratory: Denies shortness of breath. Gastrointestinal: No abdominal pain.  No nausea, no vomiting.  No diarrhea.  No constipation. Genitourinary: Negative for dysuria. Musculoskeletal: Negative for back pain. As above.  Skin: Negative for rash. Neurological: Negative for headaches, focal weakness or numbness.  10-point ROS otherwise negative.  ____________________________________________   PHYSICAL  EXAM:  VITAL SIGNS: ED Triage Vitals  Enc Vitals Group     BP 08/26/16 1112 122/82     Pulse Rate 08/26/16 1112 61     Resp 08/26/16 1112 18     Temp 08/26/16 1112 98.7 F (37.1 C)     Temp Source 08/26/16 1112 Oral     SpO2 08/26/16 1112 98 %     Weight 08/26/16 1112 252 lb (114.3 kg)     Height 08/26/16 1112 5\' 11"  (1.803 m)     Head Circumference --      Peak Flow --      Pain Score 08/26/16 1114 3     Pain Loc --      Pain Edu? --      Excl. in [place]? --     Constitutional: Alert and oriented. Well appearing and in no acute distress. Eyes: Conjunctivae are normal. PERRL. EOMI. ENT      Head: Normocephalic and atraumatic.      Mouth/Throat: Mucous membranes are moist.Oropharynx non-erythematous. Hematological/Lymphatic/Immunilogical: No cervical lymphadenopathy. Cardiovascular: Normal rate, regular rhythm. Grossly normal heart sounds.  Good peripheral circulation. Respiratory: Normal respiratory effort without tachypnea nor retractions. Breath sounds are clear and equal bilaterally. No wheezes, rales, rhonchi. Gastrointestinal: Soft and nontender.No CVA tenderness. Musculoskeletal:  No midline cervical, thoracic or lumbar tenderness to palpation. Chest and ribs nontender to palpation.       Right lower leg:  No tenderness or edema.      Left lower leg:  No tenderness or edema.  Except: Left distal radius mild tenderness to palpation, mild pain present with wrist rotation, full range of motion present, no stuff box tenderness, left hand nontender, left him with normal distal sensation and capillary refill, bilateral hand grips strong and equal, no left hand motor or tendon deficit. Bilateral distal radial pulses equal and easily palpated. Left upper extremity otherwise nontender.  Except: Left anterior shoulder just inferior to ac joint minimal tenderness to direct palpation, however pain present in the same area with lateral abduction and resisted abduction and abduction, negative  drop arm test, negative empty can test, good strength, no visualized deformity, no ecchymosis or erythema. No clavicular tenderness. Neurologic:  Normal speech and language.  Speech is normal. No gait instability.  Skin:  Skin is warm, dry and intact. No rash noted. Psychiatric: Mood and affect are normal. Speech and behavior are normal. Patient exhibits appropriate insight and judgment   ___________________________________________   LABS (all labs ordered are listed, but only abnormal results are displayed)  Labs Reviewed - No data to display ____________________________________________  RADIOLOGY  Dg Wrist Complete Left  Result Date: 08/26/2016 CLINICAL DATA:  Pt fell this am falling to the left catching self with left arm. Most pain in left prox ant humeral head and left medial side of wrist EXAM: LEFT WRIST - COMPLETE 3+ VIEW COMPARISON:  None. FINDINGS: There is no evidence of fracture or dislocation. There is no evidence of arthropathy or other focal bone abnormality. Soft tissues are unremarkable. IMPRESSION: Negative. Electronically Signed   By: Lucrezia Europe M.D.   On: 08/26/2016 12:54   Dg Shoulder Left  Result Date: 08/26/2016 CLINICAL DATA:  Pt fell this am falling to the  left catching self with left arm. Most pain in left prox ant humeral head and left medial side of wrist EXAM: LEFT SHOULDER - 2+ VIEW COMPARISON:  None. FINDINGS: There is no evidence of fracture or dislocation. There is no evidence of arthropathy or other focal bone abnormality. Soft tissues are unremarkable. IMPRESSION: Negative. Electronically Signed   By: Lucrezia Europe M.D.   On: 08/26/2016 12:54   ____________________________________________   PROCEDURES Procedures   Left Velcro cock up splint and sling applied by RN. ____________________________________________   INITIAL IMPRESSION / [place] / ED COURSE  Pertinent labs & imaging results that were available during my care of the patient were  reviewed by me and considered in my medical decision making (see chart for details).  Well-appearing patient. No acute distress. Presents for the complaints of left shoulder and left wrist pain post mechanical fall prior to arrival. 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 Suspect sprain and contusion injuries. Per radiologist's left wrist and left shoulder x-rays negative for acute bony abnormality. Discussed in detail with patient, recommend supportive care and treatment. 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 Discussed with patient possible strain of left rotator cuff as well. Encouraged supportive care and rest. Will place and Velcro cock-up splint to wear over the next 2 days as needed for pain as well as included wrist range of motion exercises multiple times per day. Will place and sling for support today, and discussed with patient pendulum exercises as well as ice and rest. Discussed the patient may return to work as of today but no lifting greater than 10 pounds and especially overhead lifting. Recommended for patient to follow-up with [place] FNP in 3-4 days, information given. Will treat with oral daily Mobic.Discussed indication, risks and benefits of medications with patient.  Discussed follow up with Primary care physician this week. Discussed follow up and return parameters including no resolution or any worsening concerns. Patient verbalized understanding and agreed to plan.   ____________________________________________   FINAL CLINICAL IMPRESSION(S) / ED DIAGNOSES  Final diagnoses:  Sprain of left wrist, initial encounter  Acute pain of left shoulder     Discharge Medication List as of 08/26/2016  1:24 PM    START taking these medications   Details  meloxicam (MOBIC) 15 MG tablet Take 1 tablet (15 mg total) by mouth daily as needed for pain., Starting Mon 08/26/2016, Normal        Note: This dictation was  prepared with Dragon dictation along with smaller phrase technology. Any transcriptional errors that result from this process are unintentional.        Marylene Land, NP 08/26/16 1859

## 2016-08-26 NOTE — Discharge Instructions (Signed)
Take medication as prescribed. Rest wrist and shoulder, but stretch multiple times per day as discussed. Apply ice.   Follow up with Blima Singer this week as discussed. Call to schedule.   Follow up with your primary care physician this week as needed. Return to Urgent care for new or worsening concerns.

## 2016-08-26 NOTE — ED Triage Notes (Signed)
Pt fell at work about 1 hour ago he tripped over a pallet and landed on concrete on his left side, Shoulder pain and wrist pain on the left side

## 2016-10-16 ENCOUNTER — Ambulatory Visit (INDEPENDENT_AMBULATORY_CARE_PROVIDER_SITE_OTHER): Payer: Commercial Managed Care - HMO | Admitting: Family Medicine

## 2016-10-16 ENCOUNTER — Encounter: Payer: Self-pay | Admitting: Family Medicine

## 2016-10-16 VITALS — BP 120/86 | HR 60 | Temp 98.3°F | Resp 16 | Wt 253.0 lb

## 2016-10-16 DIAGNOSIS — E119 Type 2 diabetes mellitus without complications: Secondary | ICD-10-CM

## 2016-10-16 DIAGNOSIS — E1165 Type 2 diabetes mellitus with hyperglycemia: Secondary | ICD-10-CM

## 2016-10-16 DIAGNOSIS — E118 Type 2 diabetes mellitus with unspecified complications: Secondary | ICD-10-CM | POA: Diagnosis not present

## 2016-10-16 DIAGNOSIS — E881 Lipodystrophy, not elsewhere classified: Secondary | ICD-10-CM | POA: Diagnosis not present

## 2016-10-16 DIAGNOSIS — IMO0002 Reserved for concepts with insufficient information to code with codable children: Secondary | ICD-10-CM

## 2016-10-16 LAB — POCT GLYCOSYLATED HEMOGLOBIN (HGB A1C)
Est. average glucose Bld gHb Est-mCnc: 197
Hemoglobin A1C: 8.5

## 2016-10-16 NOTE — Progress Notes (Signed)
Patient: Douglas Murray Male    DOB: 1971/07/17   46 y.o.   MRN: [id number] Visit Date: 10/16/2016  Today's Provider: Lelon Huh, MD   Chief Complaint  Patient presents with  . Diabetes   Subjective:    HPI  Diabetes Mellitus Type II, Follow-up:   Lab Results  Component Value Date   HGBA1C 12.1 07/30/2016   HGBA1C 9.3 02/27/2016   HGBA1C 8.0 10/10/2015    Last seen for diabetes 2 months ago.  Management since then includes no changes; continue Invokana and return in 2 months to recheck HgbA1C. Marland Kitchen He reports good compliance with treatment. He is not having side effects.  Current symptoms include paresthesia of the feet and polydipsia and have been stable. Home blood sugar records: fasting range: 218-220  Episodes of hypoglycemia? no   Current Insulin Regimen: none Most Recent Eye Exam: < 1 year ago Weight trend: stable Prior visit with dietician: no Current diet: in general, a "healthy" diet   Current exercise: cardiovascular workout on exercise equipment  Pertinent Labs:    Component Value Date/Time   CHOL 163 02/27/2016 1657   TRIG 300 (H) 02/27/2016 1657   HDL 21 (L) 02/27/2016 1657   LDLCALC 82 02/27/2016 1657   CREATININE 1.45 (H) 08/20/2016 1639    Wt Readings from Last 3 Encounters:  08/26/16 252 lb (114.3 kg)  08/20/16 252 lb (114.3 kg)  07/30/16 257 lb (116.6 kg)    ------------------------------------------------------------------------ Follow up Hypokalemia:  Patient was last seen for this problem 2 months ago. During that visit labs were ordered showing low potassium levels. Patient was advised to get more potassium in his diet by consuming more potassium rich foods. Patient comes in today reporting that he has been eating more potassium rich foods.    No Known Allergies   Current Outpatient Prescriptions:  .  atenolol-chlorthalidone (TENORETIC) 50-25 MG tablet, TAKE 1 TABLET DAILY, Disp: 90 tablet, Rfl: 4 .  canagliflozin  (INVOKANA) 100 MG TABS tablet, Take 1 tablet (100 mg total) by mouth daily before breakfast., Disp: 30 tablet, Rfl: 3 .  meloxicam (MOBIC) 15 MG tablet, Take 1 tablet (15 mg total) by mouth daily as needed for pain., Disp: 10 tablet, Rfl: 0 .  metFORMIN (GLUCOPHAGE-XR) 500 MG 24 hr tablet, Take 2 tablets (1,000 mg total) by mouth daily with breakfast. (Patient taking differently: Take 500 mg by mouth daily with breakfast. ), Disp: 30 tablet, Rfl: 4 .  pravastatin (PRAVACHOL) 40 MG tablet, Take 1 tablet (40 mg total) by mouth daily., Disp: 30 tablet, Rfl: 5  Review of Systems  Constitutional: Negative for appetite change, chills and fever.  Respiratory: Negative for chest tightness, shortness of breath and wheezing.   Cardiovascular: Negative for chest pain and palpitations.  Gastrointestinal: Negative for abdominal pain, nausea and vomiting.  Endocrine: Positive for polydipsia. Negative for cold intolerance, heat intolerance, polyphagia and polyuria.  Neurological: Positive for numbness (in his toes).    Social History  Substance Use Topics  . Smoking status: Never Smoker  . Smokeless tobacco: Never Used  . Alcohol use No   Objective:   BP 120/86 (BP Location: Right Arm, Patient Position: Sitting, Cuff Size: Large)   Pulse 60   Temp 98.3 F (36.8 C) (Oral)   Resp 16   Wt 253 lb (114.8 kg)   SpO2 98% Comment: room air  BMI 35.29 kg/m  There were no vitals filed for this visit.   Physical Exam  General Appearance:    Alert, cooperative, no distress, obese  Eyes:    PERRL, conjunctiva/corneas clear, EOM's intact       Lungs:     Clear to auscultation bilaterally, respirations unlabored  Heart:    Regular rate and rhythm  Neurologic:   Awake, alert, oriented x 3. No apparent focal neurological           defect.       Results for orders placed or performed in visit on 10/16/16  POCT HgB A1C  Result Value Ref Range   Hemoglobin A1C 8.5    Est. average glucose Bld gHb Est-mCnc  197         Assessment & Plan:     1. Type 2 diabetes mellitus without complication, without long-term current use of insulin (HCC) Much better, continue current dose Invokana Need to recheck 'lytes due to low potassium in January - POCT HgB A1C - Renal function panel  2. 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   - Lipid panel  Return in about 3 months (around 01/21/2017).       Lelon Huh, MD  [facility]

## 2016-10-20 ENCOUNTER — Other Ambulatory Visit: Payer: Self-pay | Admitting: Family Medicine

## 2016-10-24 DIAGNOSIS — E119 Type 2 diabetes mellitus without complications: Secondary | ICD-10-CM | POA: Diagnosis not present

## 2016-10-24 DIAGNOSIS — E881 Lipodystrophy, not elsewhere classified: Secondary | ICD-10-CM | POA: Diagnosis not present

## 2016-10-25 LAB — RENAL FUNCTION PANEL
Albumin: 4.5 g/dL (ref 3.5–5.5)
BUN / CREAT RATIO: 7 — AB (ref 9–20)
BUN: 8 mg/dL (ref 6–24)
CALCIUM: 9.5 mg/dL (ref 8.7–10.2)
CHLORIDE: 96 mmol/L (ref 96–106)
CO2: 27 mmol/L (ref 18–29)
Creatinine, Ser: 1.15 mg/dL (ref 0.76–1.27)
GFR calc Af Amer: 89 mL/min/{1.73_m2} (ref >59)
GFR calc non Af Amer: 77 mL/min/{1.73_m2} (ref >59)
Glucose: 177 mg/dL — ABNORMAL HIGH (ref 65–99)
Phosphorus: 3.4 mg/dL (ref 2.5–4.5)
Potassium: 3.5 mmol/L (ref 3.5–5.2)
SODIUM: 142 mmol/L (ref 134–144)

## 2016-10-25 LAB — LIPID PANEL
CHOLESTEROL TOTAL: 165 mg/dL (ref 100–199)
Chol/HDL Ratio: 5.5 ratio — ABNORMAL HIGH (ref 0.0–5.0)
HDL: 30 mg/dL — ABNORMAL LOW (ref >39)
LDL Calculated: 113 mg/dL — ABNORMAL HIGH (ref 0–99)
Triglycerides: 108 mg/dL (ref 0–149)
VLDL Cholesterol Cal: 22 mg/dL (ref 5–40)

## 2016-10-29 NOTE — Progress Notes (Signed)
Advised ed

## 2016-11-07 ENCOUNTER — Other Ambulatory Visit: Payer: Self-pay | Admitting: Family Medicine

## 2016-11-07 DIAGNOSIS — E119 Type 2 diabetes mellitus without complications: Secondary | ICD-10-CM

## 2017-01-10 ENCOUNTER — Encounter: Payer: Self-pay | Admitting: Family Medicine

## 2017-01-10 ENCOUNTER — Ambulatory Visit (INDEPENDENT_AMBULATORY_CARE_PROVIDER_SITE_OTHER): Payer: 59 | Admitting: Family Medicine

## 2017-01-10 VITALS — BP 120/78 | HR 62 | Temp 98.6°F | Resp 16 | Ht 71.0 in | Wt 248.0 lb

## 2017-01-10 DIAGNOSIS — E881 Lipodystrophy, not elsewhere classified: Secondary | ICD-10-CM | POA: Diagnosis not present

## 2017-01-10 DIAGNOSIS — I10 Essential (primary) hypertension: Secondary | ICD-10-CM | POA: Diagnosis not present

## 2017-01-10 DIAGNOSIS — E119 Type 2 diabetes mellitus without complications: Secondary | ICD-10-CM | POA: Diagnosis not present

## 2017-01-10 LAB — POCT GLYCOSYLATED HEMOGLOBIN (HGB A1C)
Est. average glucose Bld gHb Est-mCnc: 166
Hemoglobin A1C: 7.4

## 2017-01-10 LAB — POCT UA - MICROALBUMIN: MICROALBUMIN (UR) POC: 20 mg/L

## 2017-01-10 NOTE — Progress Notes (Signed)
Patient: Douglas Murray Male    DOB: 05-14-1971   46 y.o.   MRN: [id number] Visit Date: 01/10/2017  Today's Provider: Lelon Huh, MD   Chief Complaint  Patient presents with  . Diabetes  . Hypertension  . Hyperlipidemia   Subjective:    HPI  Diabetes Mellitus Type II, Follow-up:   Lab Results  Component Value Date   HGBA1C 8.5 10/16/2016   HGBA1C 12.1 07/30/2016   HGBA1C 9.3 02/27/2016    Last seen for diabetes 3 months ago.  Management since then includes no changes. He reports good compliance with treatment. He is not having side effects.  Current symptoms include none and have been stable. Home blood sugar records: fasting range: 140s  Episodes of hypoglycemia? no   Current Insulin Regimen: none Most Recent Eye Exam: every 2 years.  Weight trend: stable Prior visit with dietician: no Current diet: well balanced Current exercise: walking  Pertinent Labs:    Component Value Date/Time   CHOL 165 10/24/2016 1045   TRIG 108 10/24/2016 1045   HDL 30 (L) 10/24/2016 1045   LDLCALC 113 (H) 10/24/2016 1045   CREATININE 1.15 10/24/2016 1045    Wt Readings from Last 3 Encounters:  01/10/17 248 lb (112.5 kg)  10/16/16 253 lb (114.8 kg)  08/26/16 252 lb (114.3 kg)      Hypertension, follow-up:  BP Readings from Last 3 Encounters:  01/10/17 120/78  10/16/16 120/86  08/26/16 122/82    He was last seen for hypertension 3 months ago.  BP at that visit was 120/86. Management since that visit includes no changes. He reports good compliance with treatment. He is not having side effects.  He is exercising. He is adherent to low salt diet.   Outside blood pressures are checked occasionally. He is experiencing none.  Patient denies exertional chest pressure/discomfort, lower extremity edema and palpitations.   Cardiovascular risk factors include diabetes mellitus and dyslipidemia.     Weight trend: stable Wt Readings from Last 3 Encounters:    01/10/17 248 lb (112.5 kg)  10/16/16 253 lb (114.8 kg)  08/26/16 252 lb (114.3 kg)    Current diet: well balanced    Lipid/Cholesterol, Follow-up:   Last seen for this3 months ago.  Management changes since that visit include no changes. . Last Lipid Panel:    Component Value Date/Time   CHOL 165 10/24/2016 1045   TRIG 108 10/24/2016 1045   HDL 30 (L) 10/24/2016 1045   CHOLHDL 5.5 (H) 10/24/2016 1045   LDLCALC 113 (H) 10/24/2016 1045    Risk factors for vascular disease include diabetes mellitus and hypertension  He reports good compliance with treatment. He is not having side effects.  Current symptoms include none and have been stable. Weight trend: stable Prior visit with dietician: no Current diet: well balanced Current exercise: walking  Wt Readings from Last 3 Encounters:  01/10/17 248 lb (112.5 kg)  10/16/16 253 lb (114.8 kg)  08/26/16 252 lb (114.3 kg)           No Known Allergies   Current Outpatient Prescriptions:  .  atenolol-chlorthalidone (TENORETIC) 50-25 MG tablet, TAKE 1 TABLET DAILY, Disp: 90 tablet, Rfl: 4 .  canagliflozin (INVOKANA) 100 MG TABS tablet, Take 1 tablet (100 mg total) by mouth daily before breakfast., Disp: 30 tablet, Rfl: 3 .  metFORMIN (GLUCOPHAGE-XR) 500 MG 24 hr tablet, TAKE 1 TABLET (500 MG TOTAL) BY MOUTH DAILY WITH BREAKFAST., Disp: 30 tablet, Rfl:  12 .  pravastatin (PRAVACHOL) 40 MG tablet, TAKE 1 TABLET (40 MG TOTAL) BY MOUTH DAILY., Disp: 30 tablet, Rfl: 5 .  meloxicam (MOBIC) 15 MG tablet, Take 1 tablet (15 mg total) by mouth daily as needed for pain. (Patient not taking: Reported on 01/10/2017), Disp: 10 tablet, Rfl: 0  Review of Systems  Constitutional: Negative.   Respiratory: Negative.   Cardiovascular: Negative.   Endocrine: Negative.   Musculoskeletal: Negative.   Neurological: Negative.     Social History  Substance Use Topics  . Smoking status: Never Smoker  . Smokeless tobacco: Never Used  .  Alcohol use No   Objective:   BP 120/78 (BP Location: Right Arm, Patient Position: Sitting, Cuff Size: Large)   Pulse 62   Temp 98.6 F (37 C)   Resp 16   Ht 5\' 11"  (1.803 m)   Wt 248 lb (112.5 kg)   SpO2 97%   BMI 34.59 kg/m  Vitals:   01/10/17 1605  BP: 120/78  Pulse: 62  Resp: 16  Temp: 98.6 F (37 C)  SpO2: 97%  Weight: 248 lb (112.5 kg)  Height: 5\' 11"  (1.803 m)     Physical Exam   General Appearance:    Alert, cooperative, no distress  Eyes:    PERRL, conjunctiva/corneas clear, EOM's intact       Lungs:     Clear to auscultation bilaterally, respirations unlabored  Heart:    Regular rate and rhythm  Neurologic:   Awake, alert, oriented x 3. No apparent focal neurological           defect.         Results for orders placed or performed in visit on 01/10/17  POCT glycosylated hemoglobin (Hb A1C)  Result Value Ref Range   Hemoglobin A1C 7.4    Est. average glucose Bld gHb Est-mCnc 166        Assessment & Plan:     1. Essential (primary) hypertension Well controlled.  Continue current medications.    2. Type 2 diabetes mellitus without complication, without long-term current use of insulin ([place]) Much better controlled. Continue current medications.   - POCT glycosylated hemoglobin (Hb A1C) - POCT UA - Microalbumin  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    Return in about 4 months (around 05/12/2017).      The entirety of the information documented in the History of Present Illness, Review of Systems and Physical Exam were personally obtained by me. Portions of this information were initially documented by Wilburt Finlay, CMA and reviewed by me for thoroughness and accuracy.    Lelon Huh, MD  [facility]

## 2017-01-17 ENCOUNTER — Ambulatory Visit: Payer: Commercial Managed Care - HMO | Admitting: Family Medicine

## 2017-01-21 ENCOUNTER — Other Ambulatory Visit: Payer: Self-pay | Admitting: Family Medicine

## 2017-01-21 DIAGNOSIS — IMO0002 Reserved for concepts with insufficient information to code with codable children: Secondary | ICD-10-CM

## 2017-01-21 DIAGNOSIS — E1165 Type 2 diabetes mellitus with hyperglycemia: Secondary | ICD-10-CM

## 2017-01-21 DIAGNOSIS — E118 Type 2 diabetes mellitus with unspecified complications: Principal | ICD-10-CM

## 2017-05-05 ENCOUNTER — Other Ambulatory Visit: Payer: Self-pay | Admitting: Family Medicine

## 2017-05-09 ENCOUNTER — Ambulatory Visit (INDEPENDENT_AMBULATORY_CARE_PROVIDER_SITE_OTHER): Payer: 59 | Admitting: Family Medicine

## 2017-05-09 ENCOUNTER — Encounter: Payer: Self-pay | Admitting: Family Medicine

## 2017-05-09 VITALS — BP 110/70 | Temp 98.4°F | Resp 16 | Ht 71.0 in | Wt 249.0 lb

## 2017-05-09 DIAGNOSIS — Z23 Encounter for immunization: Secondary | ICD-10-CM | POA: Diagnosis not present

## 2017-05-09 DIAGNOSIS — Z6834 Body mass index (BMI) 34.0-34.9, adult: Secondary | ICD-10-CM

## 2017-05-09 DIAGNOSIS — E881 Lipodystrophy, not elsewhere classified: Secondary | ICD-10-CM

## 2017-05-09 DIAGNOSIS — I10 Essential (primary) hypertension: Secondary | ICD-10-CM

## 2017-05-09 DIAGNOSIS — E669 Obesity, unspecified: Secondary | ICD-10-CM

## 2017-05-09 DIAGNOSIS — E119 Type 2 diabetes mellitus without complications: Secondary | ICD-10-CM

## 2017-05-09 LAB — POCT GLYCOSYLATED HEMOGLOBIN (HGB A1C)
Est. average glucose Bld gHb Est-mCnc: 171
Hemoglobin A1C: 7.6

## 2017-05-09 MED ORDER — METFORMIN HCL ER 500 MG PO TB24: ORAL_TABLET | ORAL | 4 refills | Status: DC

## 2017-05-09 MED ORDER — PRAVASTATIN SODIUM 40 MG PO TABS: 40.0000 mg | ORAL_TABLET | Freq: Every day | ORAL | 4 refills | Status: DC

## 2017-05-09 MED ORDER — ATENOLOL-CHLORTHALIDONE 50-25 MG PO TABS: 1.0000 | ORAL_TABLET | Freq: Every day | ORAL | 4 refills | Status: DC

## 2017-05-09 MED ORDER — CANAGLIFLOZIN 100 MG PO TABS: 100.0000 mg | ORAL_TABLET | Freq: Every day | ORAL | 4 refills | Status: DC

## 2017-05-09 NOTE — Progress Notes (Signed)
Patient: Douglas Murray Male    DOB: 1971/02/28   46 y.o.   MRN: [id number] Visit Date: 05/09/2017  Today's Provider: Lelon Huh, MD   Chief Complaint  Patient presents with  . Diabetes  . Hypertension  . Hyperlipidemia   Subjective:    HPI   Diabetes Mellitus Type II, Follow-up:   Lab Results  Component Value Date   HGBA1C 7.4 01/10/2017   HGBA1C 8.5 10/16/2016   HGBA1C 12.1 07/30/2016   Last seen for diabetes 4 months ago.  Management since then include; no changes . He reports good compliance with treatment. He is not having side effects. none Current symptoms include none and have been unchanged. Home blood sugar records: fasting range: 175  Episodes of hypoglycemia? no   Current Insulin Regimen: n/a Most Recent Eye Exam: due Weight trend: stable Prior visit with dietician: no Current diet: in general, a "healthy" diet   Current exercise: gym  ------------------------------------------------------------------------   Hypertension, follow-up:  BP Readings from Last 3 Encounters:  01/10/17 120/78  10/16/16 120/86  08/26/16 122/82    He was last seen for hypertension 4 months ago.  BP at that visit was 120/78. Management since that visit includes; no changes.He reports good compliance with treatment. He is not having side effects. none He is exercising. He is not adherent to low salt diet.   Outside blood pressures are 145/85. He is experiencing none.  Patient denies none.   Cardiovascular risk factors include diabetes mellitus.  Use of agents associated with hypertension: none.   ------------------------------------------------------------------------   Lipodystrophy From 01/10/2017-no changes.   No Known Allergies   Current Outpatient Prescriptions:  .  atenolol-chlorthalidone (TENORETIC) 50-25 MG tablet, TAKE 1 TABLET DAILY, Disp: 90 tablet, Rfl: 4 .  INVOKANA 100 MG TABS tablet, TAKE 1 TABLET (100 MG TOTAL) BY MOUTH DAILY  BEFORE BREAKFAST., Disp: 30 tablet, Rfl: 11 .  meloxicam (MOBIC) 15 MG tablet, Take 1 tablet (15 mg total) by mouth daily as needed for pain. (Patient not taking: Reported on 01/10/2017), Disp: 10 tablet, Rfl: 0 .  metFORMIN (GLUCOPHAGE-XR) 500 MG 24 hr tablet, TAKE 1 TABLET (500 MG TOTAL) BY MOUTH DAILY WITH BREAKFAST., Disp: 30 tablet, Rfl: 12 .  pravastatin (PRAVACHOL) 40 MG tablet, TAKE 1 TABLET (40 MG TOTAL) BY MOUTH DAILY., Disp: 30 tablet, Rfl: 5  Review of Systems  Constitutional: Negative for appetite change, chills and fever.  Respiratory: Negative for chest tightness, shortness of breath and wheezing.   Cardiovascular: Negative for chest pain and palpitations.  Gastrointestinal: Negative for abdominal pain, nausea and vomiting.    Social History  Substance Use Topics  . Smoking status: Never Smoker  . Smokeless tobacco: Never Used  . Alcohol use No   Objective:   BP 110/70 (BP Location: Right Arm, Patient Position: Sitting, Cuff Size: Large)   Temp 98.4 F (36.9 C) (Oral)   Resp 16   Ht 5\' 11"  (1.803 m)   Wt 249 lb (112.9 kg)   BMI 34.73 kg/m     Physical Exam  General Appearance:    Alert, cooperative, no distress, obese  Eyes:    PERRL, conjunctiva/corneas clear, EOM's intact       Lungs:     Clear to auscultation bilaterally, respirations unlabored  Heart:    Regular rate and rhythm  Neurologic:   Awake, alert, oriented x 3. No apparent focal neurological           defect.  Results for orders placed or performed in visit on 05/09/17  POCT HgB A1C  Result Value Ref Range   Hemoglobin A1C 7.6    Est. average glucose Bld gHb Est-mCnc 171         Assessment & Plan:     1. Type 2 diabetes mellitus without complication, without long-term current use of insulin (HCC) Well controlled.  Continue current medications.   - POCT HgB A1C - metFORMIN (GLUCOPHAGE-XR) 500 MG 24 hr tablet; TAKE 1 TABLET (500 MG TOTAL) BY MOUTH DAILY WITH BREAKFAST.  Dispense: 90  tablet; Refill: 4 - canagliflozin (INVOKANA) 100 MG TABS tablet; Take 1 tablet (100 mg total) by mouth daily before breakfast.  Dispense: 90 tablet; Refill: 4  2. Essential (primary) hypertension Well controlled.  Continue current medications.    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  Refilled all medications to mail order pharmacy.   4. Morbid obesity Encourage increase exercise and prudent diet for weight loss.  5. Need for influenza vaccination  - Flu Vaccine QUAD 36+ mos IM       Lelon Huh, MD  [facility]

## 2017-05-16 ENCOUNTER — Other Ambulatory Visit: Payer: Self-pay | Admitting: Family Medicine

## 2017-11-07 ENCOUNTER — Ambulatory Visit: Payer: Self-pay | Admitting: Family Medicine

## 2017-11-17 ENCOUNTER — Encounter: Payer: Self-pay | Admitting: Family Medicine

## 2017-11-17 ENCOUNTER — Ambulatory Visit: Payer: 59 | Admitting: Family Medicine

## 2017-11-17 VITALS — BP 110/80 | HR 60 | Temp 98.5°F | Resp 16 | Ht 71.0 in | Wt 246.0 lb

## 2017-11-17 DIAGNOSIS — Z23 Encounter for immunization: Secondary | ICD-10-CM

## 2017-11-17 DIAGNOSIS — G4733 Obstructive sleep apnea (adult) (pediatric): Secondary | ICD-10-CM

## 2017-11-17 DIAGNOSIS — I10 Essential (primary) hypertension: Secondary | ICD-10-CM | POA: Diagnosis not present

## 2017-11-17 DIAGNOSIS — E119 Type 2 diabetes mellitus without complications: Secondary | ICD-10-CM | POA: Diagnosis not present

## 2017-11-17 DIAGNOSIS — E881 Lipodystrophy, not elsewhere classified: Secondary | ICD-10-CM | POA: Diagnosis not present

## 2017-11-17 DIAGNOSIS — R5383 Other fatigue: Secondary | ICD-10-CM

## 2017-11-17 LAB — POCT GLYCOSYLATED HEMOGLOBIN (HGB A1C)
ESTIMATED AVERAGE GLUCOSE: 163
Hemoglobin A1C: 7.3

## 2017-11-17 MED ORDER — METFORMIN HCL ER 500 MG PO TB24: 1000.0000 mg | ORAL_TABLET | Freq: Every day | ORAL | 4 refills | Status: DC

## 2017-11-17 NOTE — Progress Notes (Addendum)
Patient: Douglas Murray Male    DOB: 1971/05/13   47 y.o.   MRN: [id number] Visit Date: 11/17/2017  Today's Provider: Lelon Huh, MD   Chief Complaint  Patient presents with  . Follow-up  . Diabetes  . Hypertension  . Hyperlipidemia   Subjective:    HPI   Diabetes Mellitus Type II, Follow-up:   Lab Results  Component Value Date   HGBA1C 7.6 05/09/2017   HGBA1C 7.4 01/10/2017   HGBA1C 8.5 10/16/2016   Last seen for diabetes 6 months ago.  Management since then includes; no changes. He reports good compliance with treatment. He is not having side effects. none Current symptoms include none and have been unchanged. Home blood sugar records: fasting range: 160  Episodes of hypoglycemia? no   Current Insulin Regimen: n/a Most Recent Eye Exam: is due.  Weight trend: stable Prior visit with dietician: no Current diet: well balanced Current exercise: gym  ----------------------------------------------------------------   Hypertension, follow-up:  BP Readings from Last 3 Encounters:  11/17/17 110/80  05/09/17 110/70  01/10/17 120/78    He was last seen for hypertension 6 months ago.  BP at that visit was 110/70. Management since that visit includes; no changes.He reports good compliance with treatment. He is not having side effects. none He is exercising. He is adherent to low salt diet.   Outside blood pressures are not checking. He is experiencing none.  Patient denies none.   Cardiovascular risk factors include diabetes mellitus.  Use of agents associated with hypertension: none.   ---------------------------------------------------------------    Lipid/Cholesterol, Follow-up:   Last seen for this 6 months ago.  Management since that visit includes; no changes.  Last Lipid Panel:    Component Value Date/Time   CHOL 165 10/24/2016 1045   TRIG 108 10/24/2016 1045   HDL 30 (L) 10/24/2016 1045   CHOLHDL 5.5 (H) 10/24/2016 1045   LDLCALC 113 (H) 10/24/2016 1045    He reports good compliance with treatment. He is not having side effects. none  Wt Readings from Last 3 Encounters:  11/17/17 246 lb (111.6 kg)  05/09/17 249 lb (112.9 kg)  01/10/17 248 lb (112.5 kg)    ---------------------------------------------------------------- He states he has been a bit more fatigued lately. He is working split shifts which he thinks is contributing to his fatigue. He states he does use his CPAP machine most of the time and his wife tells him it is effective.    No Known Allergies   Current Outpatient Medications:  .  atenolol-chlorthalidone (TENORETIC) 50-25 MG tablet, Take 1 tablet by mouth daily., Disp: 90 tablet, Rfl: 4 .  canagliflozin (INVOKANA) 100 MG TABS tablet, Take 1 tablet (100 mg total) by mouth daily before breakfast., Disp: 90 tablet, Rfl: 4 .  metFORMIN (GLUCOPHAGE-XR) 500 MG 24 hr tablet, TAKE 1 TABLET (500 MG TOTAL) BY MOUTH DAILY WITH BREAKFAST., Disp: 90 tablet, Rfl: 4 .  pravastatin (PRAVACHOL) 40 MG tablet, TAKE 1 TABLET (40 MG TOTAL) BY MOUTH DAILY., Disp: 30 tablet, Rfl: 5  Review of Systems  Constitutional: Negative for appetite change, chills and fever.  Respiratory: Negative for chest tightness, shortness of breath and wheezing.   Cardiovascular: Negative for chest pain and palpitations.  Gastrointestinal: Negative for abdominal pain, nausea and vomiting.    Social History   Tobacco Use  . Smoking status: Never Smoker  . Smokeless tobacco: Never Used  Substance Use Topics  . Alcohol use: No   Objective:  BP 110/80 (BP Location: Right Arm, Patient Position: Sitting, Cuff Size: Large)   Pulse 60   Temp 98.5 F (36.9 C) (Oral)   Resp 16   Ht 5\' 11"  (1.803 m)   Wt 246 lb (111.6 kg)   SpO2 96%   BMI 34.31 kg/m  Vitals:   11/17/17 1600  BP: 110/80  Pulse: 60  Resp: 16  Temp: 98.5 F (36.9 C)  TempSrc: Oral  SpO2: 96%  Weight: 246 lb (111.6 kg)  Height: 5\' 11"  (1.803 m)       Physical Exam   General Appearance:    Alert, cooperative, no distress  Eyes:    PERRL, conjunctiva/corneas clear, EOM's intact       Lungs:     Clear to auscultation bilaterally, respirations unlabored  Heart:    Regular rate and rhythm  Neurologic:   Awake, alert, oriented x 3. No apparent focal neurological           defect.       Results for orders placed or performed in visit on 11/17/17  POCT glycosylated hemoglobin (Hb A1C)  Result Value Ref Range   Hemoglobin A1C 7.3    Est. average glucose Bld gHb Est-mCnc 163        Assessment & Plan:     1. Type 2 diabetes mellitus without complication, without long-term current use of insulin ([place]) Is near goal. Will increase metformin to 1000mg  a day, he just had 90 tablets shipped from mail order, so will send new prescription for 180 tablets in early June.  - POCT glycosylated hemoglobin (Hb A1C) - metFORMIN (GLUCOPHAGE-XR) 500 MG 24 hr tablet; Take 2 tablets (1,000 mg total) by mouth daily.  2. Obstructive sleep apnea of adult Compliant with and medically benefiting from CPAP  3. Essential (primary) hypertension Well controlled.  Continue current medications.    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   - Lipid panel - Comprehensive metabolic panel  5. Other fatigue  - TSH - CBC  6. Need for prophylactic vaccination using tetanus and diphtheria toxoids adsorbed (Td) vaccine Td  Return in about 6 months (around 05/05/2018).       Lelon Huh, MD  [facility]

## 2017-11-18 MED ORDER — METFORMIN HCL ER 500 MG PO TB24: 1000.0000 mg | ORAL_TABLET | Freq: Every day | ORAL | 4 refills | Status: DC

## 2017-11-18 NOTE — Addendum Note (Signed)
Addended by: Lelon Huh E on: 11/18/2017 11:25 AM   Modules accepted: Orders

## 2017-12-24 ENCOUNTER — Telehealth: Payer: Self-pay | Admitting: Family Medicine

## 2017-12-24 DIAGNOSIS — E119 Type 2 diabetes mellitus without complications: Secondary | ICD-10-CM

## 2017-12-24 MED ORDER — METFORMIN HCL ER 500 MG PO TB24: 1000.0000 mg | ORAL_TABLET | Freq: Every day | ORAL | 4 refills | Status: DC

## 2017-12-24 NOTE — Telephone Encounter (Signed)
New prescription for 2 qd metformin sent to mail order.

## 2018-01-19 DIAGNOSIS — R5383 Other fatigue: Secondary | ICD-10-CM | POA: Diagnosis not present

## 2018-01-19 DIAGNOSIS — E881 Lipodystrophy, not elsewhere classified: Secondary | ICD-10-CM | POA: Diagnosis not present

## 2018-01-20 ENCOUNTER — Telehealth: Payer: Self-pay | Admitting: *Deleted

## 2018-01-20 LAB — COMPREHENSIVE METABOLIC PANEL
A/G RATIO: 2 (ref 1.2–2.2)
ALK PHOS: 73 IU/L (ref 39–117)
ALT: 35 IU/L (ref 0–44)
AST: 22 IU/L (ref 0–40)
Albumin: 4.7 g/dL (ref 3.5–5.5)
BUN/Creatinine Ratio: 8 — ABNORMAL LOW (ref 9–20)
BUN: 10 mg/dL (ref 6–24)
Bilirubin Total: 0.4 mg/dL (ref 0.0–1.2)
CALCIUM: 9.7 mg/dL (ref 8.7–10.2)
CHLORIDE: 99 mmol/L (ref 96–106)
CO2: 28 mmol/L (ref 20–29)
CREATININE: 1.23 mg/dL (ref 0.76–1.27)
GFR calc Af Amer: 81 mL/min/{1.73_m2} (ref >59)
GFR calc non Af Amer: 70 mL/min/{1.73_m2} (ref >59)
Globulin, Total: 2.4 g/dL (ref 1.5–4.5)
Glucose: 173 mg/dL — ABNORMAL HIGH (ref 65–99)
Potassium: 3.6 mmol/L (ref 3.5–5.2)
SODIUM: 144 mmol/L (ref 134–144)
Total Protein: 7.1 g/dL (ref 6.0–8.5)

## 2018-01-20 LAB — LIPID PANEL
Chol/HDL Ratio: 5.6 ratio — ABNORMAL HIGH (ref 0.0–5.0)
Cholesterol, Total: 167 mg/dL (ref 100–199)
HDL: 30 mg/dL — AB (ref >39)
LDL CALC: 109 mg/dL — AB (ref 0–99)
Triglycerides: 139 mg/dL (ref 0–149)
VLDL Cholesterol Cal: 28 mg/dL (ref 5–40)

## 2018-01-20 LAB — CBC
HEMATOCRIT: 44.3 % (ref 37.5–51.0)
Hemoglobin: 15.8 g/dL (ref 13.0–17.7)
MCH: 29.6 pg (ref 26.6–33.0)
MCHC: 35.7 g/dL (ref 31.5–35.7)
MCV: 83 fL (ref 79–97)
Platelets: 226 10*3/uL (ref 150–450)
RBC: 5.34 x10E6/uL (ref 4.14–5.80)
RDW: 14.8 % (ref 12.3–15.4)
WBC: 7.2 10*3/uL (ref 3.4–10.8)

## 2018-01-20 LAB — TSH: TSH: 2.05 u[IU]/mL (ref 0.450–4.500)

## 2018-01-20 MED ORDER — PRAVASTATIN SODIUM 80 MG PO TABS: 80.0000 mg | ORAL_TABLET | Freq: Every day | ORAL | 0 refills | Status: DC

## 2018-01-20 NOTE — Telephone Encounter (Signed)
-----   Message from Birdie Sons, MD sent at 01/20/2018  9:07 AM EDT ----- LDL cholesterol is 109, needs to be under 100. Otherwise labs normal. Increase pravastatin to 80mg  daily, #30, RF X 3. Follow up in October as scheduled.

## 2018-01-20 NOTE — Telephone Encounter (Signed)
Patient was notified of results. Expressed understanding. Rx sent to pharmacy. 

## 2018-02-10 DIAGNOSIS — E119 Type 2 diabetes mellitus without complications: Secondary | ICD-10-CM | POA: Diagnosis not present

## 2018-02-10 DIAGNOSIS — H524 Presbyopia: Secondary | ICD-10-CM | POA: Diagnosis not present

## 2018-03-26 ENCOUNTER — Other Ambulatory Visit: Payer: Self-pay

## 2018-03-26 MED ORDER — METFORMIN HCL ER 500 MG PO TB24: 1000.0000 mg | ORAL_TABLET | Freq: Every day | ORAL | 0 refills | Status: DC

## 2018-03-26 NOTE — Telephone Encounter (Signed)
[facility] [place] 

## 2018-03-26 NOTE — Telephone Encounter (Signed)
Patient was advised.  

## 2018-03-26 NOTE — Telephone Encounter (Signed)
Patient reports that he run out of his Metformin and that his mail order is going to take a few days and wants to know if he can have some additional doses [facility] [place].  Cb#[phone]

## 2018-03-26 NOTE — Telephone Encounter (Signed)
Please advise 

## 2018-04-03 ENCOUNTER — Other Ambulatory Visit: Payer: Self-pay | Admitting: Family Medicine

## 2018-05-03 ENCOUNTER — Other Ambulatory Visit: Payer: Self-pay | Admitting: Family Medicine

## 2018-05-17 ENCOUNTER — Other Ambulatory Visit: Payer: Self-pay | Admitting: Family Medicine

## 2018-05-19 ENCOUNTER — Encounter: Payer: Self-pay | Admitting: Family Medicine

## 2018-05-19 ENCOUNTER — Ambulatory Visit: Payer: 59 | Admitting: Family Medicine

## 2018-05-19 VITALS — BP 120/80 | HR 60 | Temp 98.5°F | Resp 16 | Wt 244.0 lb

## 2018-05-19 DIAGNOSIS — I10 Essential (primary) hypertension: Secondary | ICD-10-CM

## 2018-05-19 DIAGNOSIS — E119 Type 2 diabetes mellitus without complications: Secondary | ICD-10-CM | POA: Diagnosis not present

## 2018-05-19 DIAGNOSIS — Z23 Encounter for immunization: Secondary | ICD-10-CM

## 2018-05-19 LAB — POCT GLYCOSYLATED HEMOGLOBIN (HGB A1C)
Est. average glucose Bld gHb Est-mCnc: 180
HEMOGLOBIN A1C: 7.9 % — AB (ref 4.0–5.6)

## 2018-05-19 MED ORDER — CANAGLIFLOZIN 100 MG PO TABS: 100.0000 mg | ORAL_TABLET | Freq: Every day | ORAL | 4 refills | Status: DC

## 2018-05-19 NOTE — Progress Notes (Signed)
Patient: Douglas Murray Male    DOB: 1971/01/28   47 y.o.   MRN: [id number] Visit Date: 05/19/2018  Today's Provider: Lelon Huh, MD   Chief Complaint  Patient presents with  . Diabetes  . Hypertension  . Hyperlipidemia   Subjective:    HPI  Diabetes Mellitus Type II, Follow-up:   Lab Results  Component Value Date   HGBA1C 7.3 11/17/2017   HGBA1C 7.6 05/09/2017   HGBA1C 7.4 01/10/2017   Last seen for diabetes 6 months ago.  Management since then includes increasing Metformin to 1000mg  daily. He reports good compliance with treatment. He is not having side effects.  Current symptoms include none and have been stable. Home blood sugar records: postprandial range: 180-190  Episodes of hypoglycemia? no   Current Insulin Regimen: none Most Recent Eye Exam: <1 year ago Weight trend: stable Prior visit with dietician: no Current diet: in general, an "unhealthy" diet Current exercise: cardiovascular workout on exercise equipment  ------------------------------------------------------------------------   Hypertension, follow-up:  BP Readings from Last 3 Encounters:  05/19/18 120/80  11/17/17 110/80  05/09/17 110/70    He was last seen for hypertension 6 months ago.  BP at that visit was 110/80. Management since that visit includes no changes.He reports good compliance with treatment. He is not having side effects.  He is exercising. He is adherent to low salt diet.   Outside blood pressures are not being checked. He is experiencing none.  Patient denies chest pain, chest pressure/discomfort, claudication, dyspnea, exertional chest pressure/discomfort, fatigue, irregular heart beat, lower extremity edema, near-syncope, orthopnea, palpitations, paroxysmal nocturnal dyspnea, syncope and tachypnea.   Cardiovascular risk factors include diabetes mellitus, dyslipidemia, hypertension and male gender.  Use of agents associated with hypertension: none.    ------------------------------------------------------------------------    Lipid/Cholesterol, Follow-up:   Last seen for this 6 months ago.  Management since that visit includes increasing Pravastatin to 80mg  daily.  Last Lipid Panel:    Component Value Date/Time   CHOL 167 01/19/2018 0907   TRIG 139 01/19/2018 0907   HDL 30 (L) 01/19/2018 0907   CHOLHDL 5.6 (H) 01/19/2018 0907   LDLCALC 109 (H) 01/19/2018 2841    He reports good compliance with treatment. He is not having side effects.   Wt Readings from Last 3 Encounters:  05/19/18 244 lb (110.7 kg)  11/17/17 246 lb (111.6 kg)  05/09/17 249 lb (112.9 kg)    ------------------------------------------------------------------------     No Known Allergies   Current Outpatient Medications:  .  atenolol-chlorthalidone (TENORETIC) 50-25 MG tablet, TAKE 1 TABLET BY MOUTH  DAILY, Disp: 90 tablet, Rfl: 4 .  canagliflozin (INVOKANA) 100 MG TABS tablet, Take 1 tablet (100 mg total) by mouth daily before breakfast., Disp: 90 tablet, Rfl: 4 .  pravastatin (PRAVACHOL) 80 MG tablet, TAKE 1 TABLET BY MOUTH  DAILY, Disp: 90 tablet, Rfl: 1 .  metFORMIN (GLUCOPHAGE-XR) 500 MG 24 hr tablet, TAKE 2 TABLETS (1,000 MG TOTAL) BY MOUTH DAILY WITH BREAKFAST FOR 15 DAYS., Disp: 30 tablet, Rfl: 11  Review of Systems  Constitutional: Negative for appetite change, chills and fever.  Respiratory: Negative for chest tightness, shortness of breath and wheezing.   Cardiovascular: Negative for chest pain and palpitations.  Gastrointestinal: Negative for abdominal pain, nausea and vomiting.  Musculoskeletal: Positive for back pain (upper back pain for several months).    Social History   Tobacco Use  . Smoking status: Never Smoker  . Smokeless tobacco: Never Used  Substance Use Topics  . Alcohol use: No   Objective:   BP 120/80 (BP Location: Left Arm, Patient Position: Sitting, Cuff Size: Large)   Pulse 60   Temp 98.5 F (36.9 C)  (Oral)   Resp 16   Wt 244 lb (110.7 kg)   SpO2 96% Comment: room air  BMI 34.03 kg/m     Physical Exam   General Appearance:    Alert, cooperative, no distress  Eyes:    PERRL, conjunctiva/corneas clear, EOM's intact       Lungs:     Clear to auscultation bilaterally, respirations unlabored  Heart:    Regular rate and rhythm  Neurologic:   Awake, alert, oriented x 3. No apparent focal neurological           defect.       Results for orders placed or performed in visit on 05/19/18  POCT HgB A1C  Result Value Ref Range   Hemoglobin A1C 7.9 (A) 4.0 - 5.6 %   HbA1c POC (<> result, manual entry)     HbA1c, POC (prediabetic range)     HbA1c, POC (controlled diabetic range)     Est. average glucose Bld gHb Est-mCnc 180        Assessment & Plan:     1. Type 2 diabetes mellitus without complication, without long-term current use of insulin ([place]) He feels he can make significant improvements with diet. [place] recheck a1c in 3-4 months. If a1c not improving wil likely need to increase invokan.  - POCT HgB A1C - canagliflozin (INVOKANA) 100 MG TABS tablet; Take 1 tablet (100 mg total) by mouth daily before breakfast.  Dispense: 90 tablet; Refill: 4  2. Need for influenza vaccination  - Flu Vaccine QUAD 6+ mos PF IM (Fluarix Quad PF)  3. Essential (primary) hypertension Well controlled.  Continue current medications.        Lelon Huh, MD  [facility]

## 2018-07-28 ENCOUNTER — Other Ambulatory Visit: Payer: Self-pay | Admitting: Family Medicine

## 2018-07-28 DIAGNOSIS — E119 Type 2 diabetes mellitus without complications: Secondary | ICD-10-CM

## 2018-07-28 MED ORDER — CANAGLIFLOZIN 100 MG PO TABS: 100.0000 mg | ORAL_TABLET | Freq: Every day | ORAL | 4 refills | Status: DC

## 2018-07-28 NOTE — Telephone Encounter (Signed)
Pt needing a refill on:  canagliflozin (INVOKANA) 100 MG TABS tablet  - pt wants to know or be called on how many will be in the refill.  [facility] fill at:   [id number] [place] [phone] (Phone) [phone] (Fax)    Thanks, [employer]

## 2018-07-31 ENCOUNTER — Other Ambulatory Visit: Payer: Self-pay

## 2018-07-31 DIAGNOSIS — E119 Type 2 diabetes mellitus without complications: Secondary | ICD-10-CM

## 2018-07-31 MED ORDER — CANAGLIFLOZIN 100 MG PO TABS: 100.0000 mg | ORAL_TABLET | Freq: Every day | ORAL | 1 refills | Status: DC

## 2018-07-31 NOTE — Telephone Encounter (Signed)
Patient re-ordered invokana to mail order pharmacy and [facility] because the original script was written for #90 w/4 refills. [facility] is giving [facility] the [facility] to not [facility] until [facility].   Please send in a 30 day supply to local pharmacy instead to last until he receives mail order. Patient is out, and we do not have samples.   Hopefully this makes sense. See me if it doesn't. Thanks!

## 2018-07-31 NOTE — Telephone Encounter (Signed)
Patient is requesting Invokana to be called into local pharmacy until he receives his mail order Rx. We currently do not have any samples. Please review. Thanks!

## 2018-09-21 ENCOUNTER — Ambulatory Visit: Payer: Self-pay | Admitting: Family Medicine

## 2018-09-22 ENCOUNTER — Other Ambulatory Visit: Payer: Self-pay | Admitting: Family Medicine

## 2018-09-29 ENCOUNTER — Encounter: Payer: Self-pay | Admitting: Family Medicine

## 2018-09-29 ENCOUNTER — Ambulatory Visit: Payer: 59 | Admitting: Family Medicine

## 2018-09-29 VITALS — BP 132/90 | HR 61 | Temp 98.9°F | Resp 18 | Ht 71.0 in | Wt 242.0 lb

## 2018-09-29 DIAGNOSIS — I10 Essential (primary) hypertension: Secondary | ICD-10-CM | POA: Diagnosis not present

## 2018-09-29 DIAGNOSIS — Z1389 Encounter for screening for other disorder: Secondary | ICD-10-CM

## 2018-09-29 DIAGNOSIS — E881 Lipodystrophy, not elsewhere classified: Secondary | ICD-10-CM

## 2018-09-29 DIAGNOSIS — E119 Type 2 diabetes mellitus without complications: Secondary | ICD-10-CM | POA: Diagnosis not present

## 2018-09-29 LAB — POCT GLYCOSYLATED HEMOGLOBIN (HGB A1C)
Est. average glucose Bld gHb Est-mCnc: 192
HEMOGLOBIN A1C: 8.3 % — AB (ref 4.0–5.6)

## 2018-09-29 LAB — POCT UA - MICROALBUMIN: Microalbumin Ur, POC: 20 mg/L

## 2018-09-29 NOTE — Progress Notes (Signed)
Patient: Douglas Murray Male    DOB: 1971/06/30   48 y.o.   MRN: [id number] Visit Date: 09/29/2018  Today's Provider: Lelon Huh, MD   Chief Complaint  Patient presents with  . Diabetes  . Hypertension   Subjective:     HPI  Diabetes Mellitus Type II, Follow-up:   Lab Results  Component Value Date   HGBA1C 7.9 (A) 05/19/2018   HGBA1C 7.3 11/17/2017   HGBA1C 7.6 05/09/2017    Last seen for diabetes 4 months ago.  Management since then includes no changes. He reports good compliance with treatment. He is not having side effects.  Current symptoms include none and have been stable. Home blood sugar records: random blood sugar averages around 200 or more  Episodes of hypoglycemia? no   Current Insulin Regimen: none Most Recent Eye Exam: <1 year ago Weight trend: fluctuating a bit Prior visit with dietician: No Current exercise: walking 30 minutes twice a week.  Current diet habits: well balanced, although has not been following diet as well the last few weeks.    Pertinent Labs:    Component Value Date/Time   CHOL 167 01/19/2018 0907   TRIG 139 01/19/2018 0907   HDL 30 (L) 01/19/2018 0907   LDLCALC 109 (H) 01/19/2018 0907   CREATININE 1.23 01/19/2018 0907    Wt Readings from Last 3 Encounters:  09/29/18 242 lb (109.8 kg)  05/19/18 244 lb (110.7 kg)  11/17/17 246 lb (111.6 kg)    ------------------------------------------------------------------------  Hypertension, follow-up:  BP Readings from Last 3 Encounters:  09/29/18 132/90  05/19/18 120/80  11/17/17 110/80    He was last seen for hypertension 4 months ago.  BP at that visit was 120/80. Management since that visit includes no changes. He reports good compliance with treatment. He is not having side effects.  He is exercising. He is adherent to low salt diet.   Outside blood pressures are checked occasionally. He is experiencing none.  Patient denies chest pain, chest  pressure/discomfort, claudication, dyspnea, exertional chest pressure/discomfort, fatigue, irregular heart beat, lower extremity edema, near-syncope, orthopnea, palpitations, paroxysmal nocturnal dyspnea, syncope and tachypnea.   Cardiovascular risk factors include hypertension and male gender.  Use of agents associated with hypertension: none.     Weight trend: fluctuating a bit Wt Readings from Last 3 Encounters:  09/29/18 242 lb (109.8 kg)  05/19/18 244 lb (110.7 kg)  11/17/17 246 lb (111.6 kg)    Current diet: well balanced  ------------------------------------------------------------------------  No Known Allergies   Current Outpatient Medications:  .  atenolol-chlorthalidone (TENORETIC) 50-25 MG tablet, TAKE 1 TABLET BY MOUTH  DAILY, Disp: 90 tablet, Rfl: 4 .  canagliflozin (INVOKANA) 100 MG TABS tablet, Take 1 tablet (100 mg total) by mouth daily before breakfast., Disp: 30 tablet, Rfl: 1 .  pravastatin (PRAVACHOL) 80 MG tablet, TAKE 1 TABLET BY MOUTH  DAILY, Disp: 90 tablet, Rfl: 4 .  metFORMIN (GLUCOPHAGE-XR) 500 MG 24 hr tablet, TAKE 2 TABLETS (1,000 MG TOTAL) BY MOUTH DAILY WITH BREAKFAST FOR 15 DAYS., Disp: 30 tablet, Rfl: 11  Review of Systems  Constitutional: Negative for appetite change, chills and fever.  Respiratory: Negative for chest tightness, shortness of breath and wheezing.   Cardiovascular: Negative for chest pain and palpitations.  Gastrointestinal: Negative for abdominal pain, nausea and vomiting.    Social History   Tobacco Use  . Smoking status: Never Smoker  . Smokeless tobacco: Never Used  Substance Use Topics  .  Alcohol use: No      Objective:   BP 132/90 (BP Location: Right Arm, Patient Position: Sitting, Cuff Size: Large)   Pulse 61   Temp 98.9 F (37.2 C) (Oral)   Resp 18   Ht 5\' 11"  (1.803 m)   Wt 242 lb (109.8 kg)   SpO2 97% Comment: room air  BMI 33.75 kg/m  Vitals:   09/29/18 0840  BP: 132/90  Pulse: 61  Resp: 18  Temp:  98.9 F (37.2 C)  TempSrc: Oral  SpO2: 97%  Weight: 242 lb (109.8 kg)  Height: 5\' 11"  (1.803 m)     Physical Exam  General appearance: alert, well developed, well nourished, cooperative and in no distress Head: Normocephalic, without obvious abnormality, atraumatic Respiratory: Respirations even and unlabored, normal respiratory rate Extremities: No gross deformities Skin: Skin color, texture, turgor normal. No rashes seen  Psych: Appropriate mood and affect. Neurologic: Mental status: Alert, oriented to person, place, and time, thought content appropriate.  Results for orders placed or performed in visit on 09/29/18  POCT HgB A1C  Result Value Ref Range   Hemoglobin A1C 8.3 (A) 4.0 - 5.6 %   HbA1c POC (<> result, manual entry)     HbA1c, POC (prediabetic range)     HbA1c, POC (controlled diabetic range)     Est. average glucose Bld gHb Est-mCnc 192        Assessment & Plan    1. Type 2 diabetes mellitus without complication, without long-term current use of insulin (HCC) Worsening control. Compliant with medications. Anticipate increasing Invokana if electrolytes stable. Recommend increase exercise.  - POCT HgB A1C - POCT UA - Microalbumin  2. 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   - Comprehensive metabolic panel - Lipid panel  3. Screening for nephropathy  - POCT UA - Microalbumin  4. Hypertension Well controlled. Continue current medications.      Lelon Huh, MD  [facility]

## 2018-09-29 NOTE — Patient Instructions (Signed)
.   Please review the attached list of medications and notify my office if there are any errors.   It is recommended to engage in 150 minutes of moderate exercise every week.

## 2018-09-30 ENCOUNTER — Telehealth: Payer: Self-pay

## 2018-09-30 ENCOUNTER — Other Ambulatory Visit: Payer: Self-pay | Admitting: Family Medicine

## 2018-09-30 DIAGNOSIS — E119 Type 2 diabetes mellitus without complications: Secondary | ICD-10-CM

## 2018-09-30 DIAGNOSIS — E876 Hypokalemia: Secondary | ICD-10-CM

## 2018-09-30 LAB — COMPREHENSIVE METABOLIC PANEL
A/G RATIO: 1.7 (ref 1.2–2.2)
ALBUMIN: 4.6 g/dL (ref 4.0–5.0)
ALK PHOS: 67 IU/L (ref 39–117)
ALT: 39 IU/L (ref 0–44)
AST: 24 IU/L (ref 0–40)
BILIRUBIN TOTAL: 0.6 mg/dL (ref 0.0–1.2)
BUN / CREAT RATIO: 9 (ref 9–20)
BUN: 12 mg/dL (ref 6–24)
CHLORIDE: 98 mmol/L (ref 96–106)
CO2: 27 mmol/L (ref 20–29)
Calcium: 9.9 mg/dL (ref 8.7–10.2)
Creatinine, Ser: 1.28 mg/dL — ABNORMAL HIGH (ref 0.76–1.27)
GFR calc non Af Amer: 67 mL/min/{1.73_m2} (ref >59)
GFR, EST AFRICAN AMERICAN: 77 mL/min/{1.73_m2} (ref >59)
Globulin, Total: 2.7 g/dL (ref 1.5–4.5)
Glucose: 158 mg/dL — ABNORMAL HIGH (ref 65–99)
POTASSIUM: 3.1 mmol/L — AB (ref 3.5–5.2)
SODIUM: 141 mmol/L (ref 134–144)
TOTAL PROTEIN: 7.3 g/dL (ref 6.0–8.5)

## 2018-09-30 LAB — LIPID PANEL
Chol/HDL Ratio: 5.3 ratio — ABNORMAL HIGH (ref 0.0–5.0)
Cholesterol, Total: 158 mg/dL (ref 100–199)
HDL: 30 mg/dL — ABNORMAL LOW (ref >39)
LDL Calculated: 95 mg/dL (ref 0–99)
Triglycerides: 164 mg/dL — ABNORMAL HIGH (ref 0–149)
VLDL Cholesterol Cal: 33 mg/dL (ref 5–40)

## 2018-09-30 NOTE — Telephone Encounter (Signed)
-----   Message from Birdie Sons, MD sent at 09/30/2018  8:17 AM EDT ----- Potassium is a little low, sugar is high, rest of labs are good.  Change Invokana to 300mg  once a day, #30, rf x 3.  Start kdur 45mEQ once a day, #90 rf x 3,  Schedule follow up o.v. to check a1c and potassium in 3 months.

## 2018-09-30 NOTE — Telephone Encounter (Signed)
[id number] 

## 2018-10-02 NOTE — Telephone Encounter (Signed)
LMTCB dbs

## 2018-10-05 MED ORDER — POTASSIUM CHLORIDE CRYS ER 20 MEQ PO TBCR: 20.0000 meq | EXTENDED_RELEASE_TABLET | Freq: Every day | ORAL | 3 refills | Status: DC

## 2018-10-05 MED ORDER — CANAGLIFLOZIN 300 MG PO TABS: 300.0000 mg | ORAL_TABLET | Freq: Every day | ORAL | 3 refills | Status: DC

## 2018-10-05 NOTE — Telephone Encounter (Signed)
Patient advised and agrees with treatment plan.  sent into pharmacy. Follow up appointment scheduled 06/26/202:0 at 9:40am.

## 2018-10-08 ENCOUNTER — Telehealth: Payer: Self-pay | Admitting: Family Medicine

## 2018-10-08 NOTE — Telephone Encounter (Signed)
Pt called concerned that his Invokana 100 mg was changed to 300 mg.  He does not wan to take 300 mg.  He wants to stay at 100 mg  He said another medication was added and he does not know the name of it.  He said he does not want to take the added on medication either.  He wants to know why the Invokana dosage was changed and why another medication was added.  CB#  [phone]

## 2018-10-08 NOTE — Telephone Encounter (Signed)
I looks like you added Potassium and increased Invokana to 300mg  on 09/30/2018.   Thanks,   -Mickel Baas

## 2018-10-09 NOTE — Telephone Encounter (Signed)
100mg  is only the starting dose for Invokana, 300mg  is the standard maintenance dose. He needs to take the potassium supplement because his potassium level is too low.

## 2018-10-09 NOTE — Telephone Encounter (Signed)
Pt advised.  He agreed to proceed with Invokana 300mg  and Potassium.   Thanks,   -Mickel Baas

## 2019-01-11 IMAGING — CR DG SHOULDER 2+V*L*
3 series · 3 of 3 positions shown · non-contrast
Comparison: None.

CLINICAL DATA: Pt fell this am falling to the left catching self
with left arm. Most pain in left prox ant humeral head and left
medial side of wrist

EXAM:
LEFT SHOULDER - 2+ VIEW

[shoulder grashey]
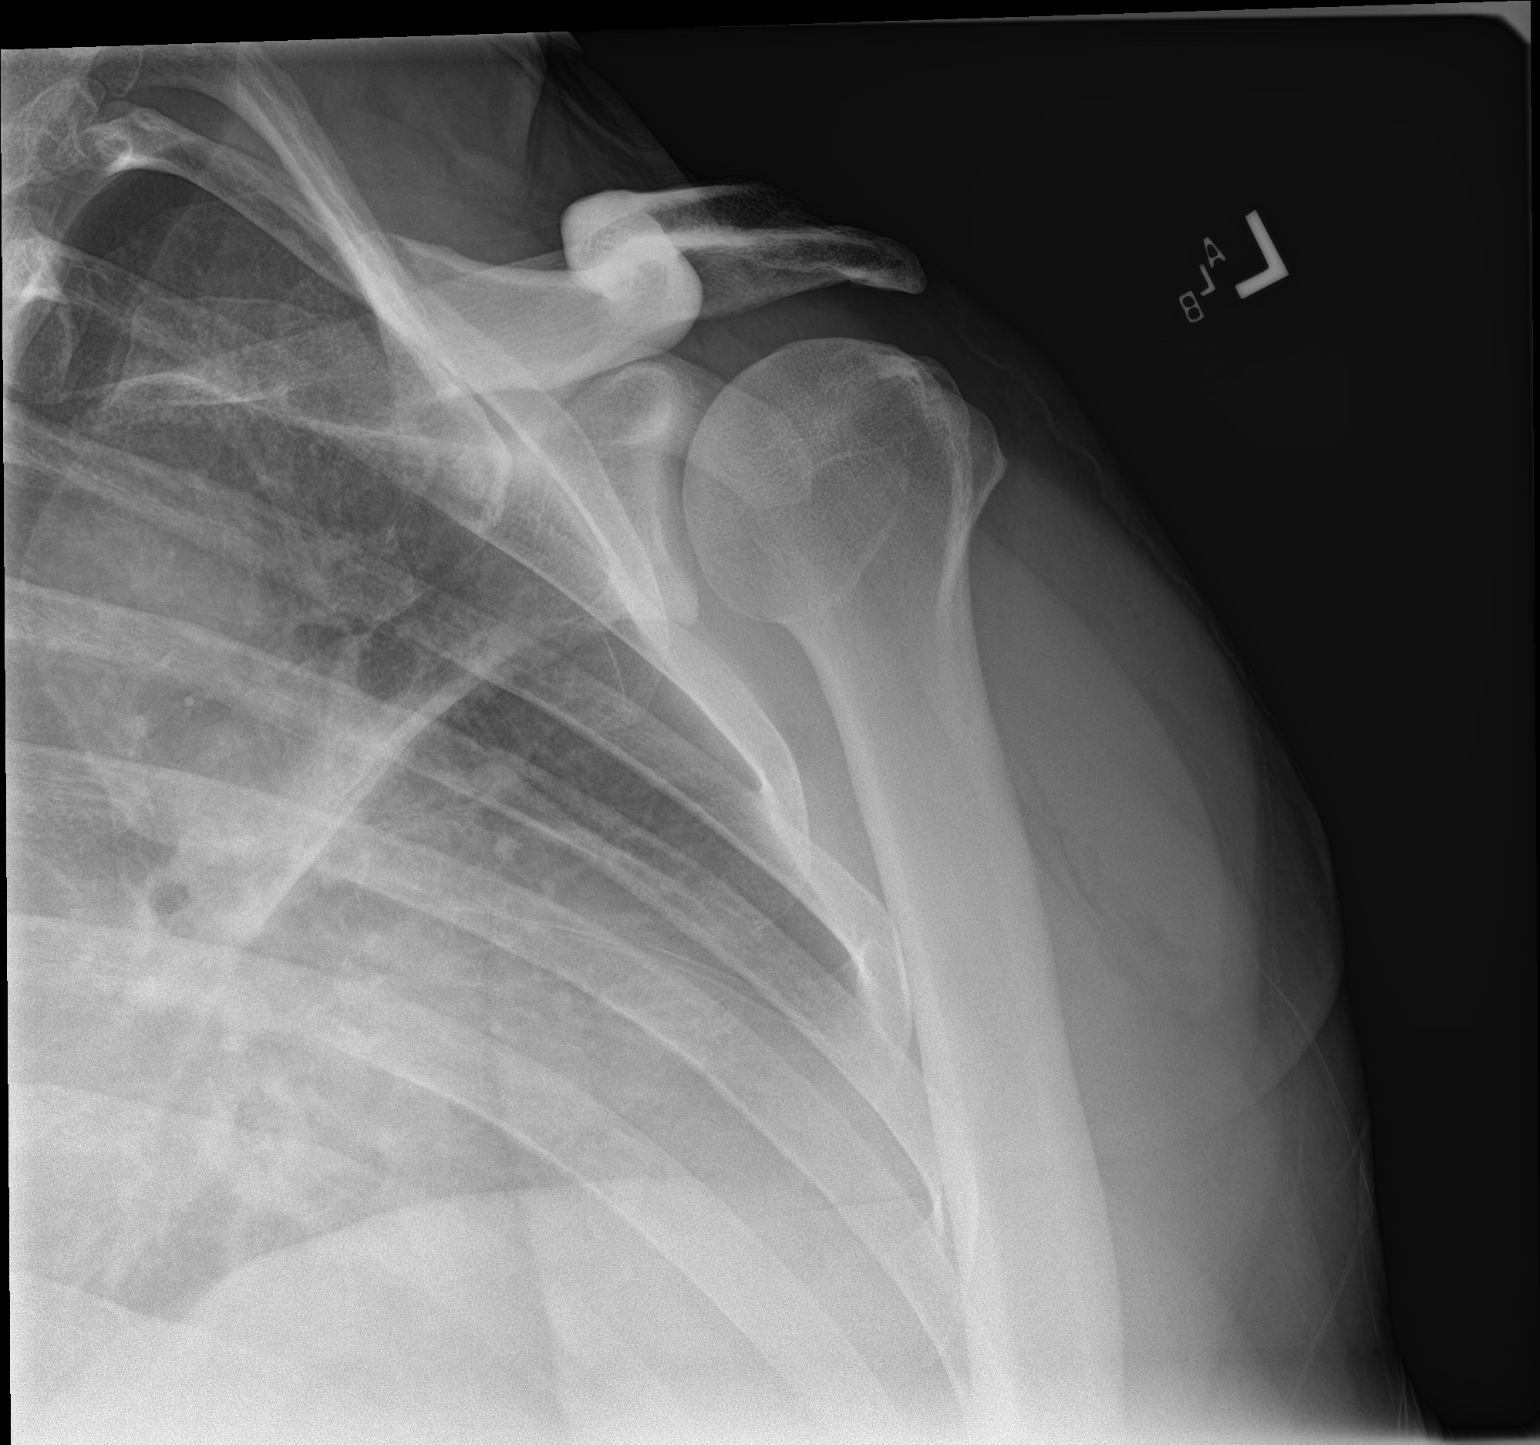

[shoulder y view]
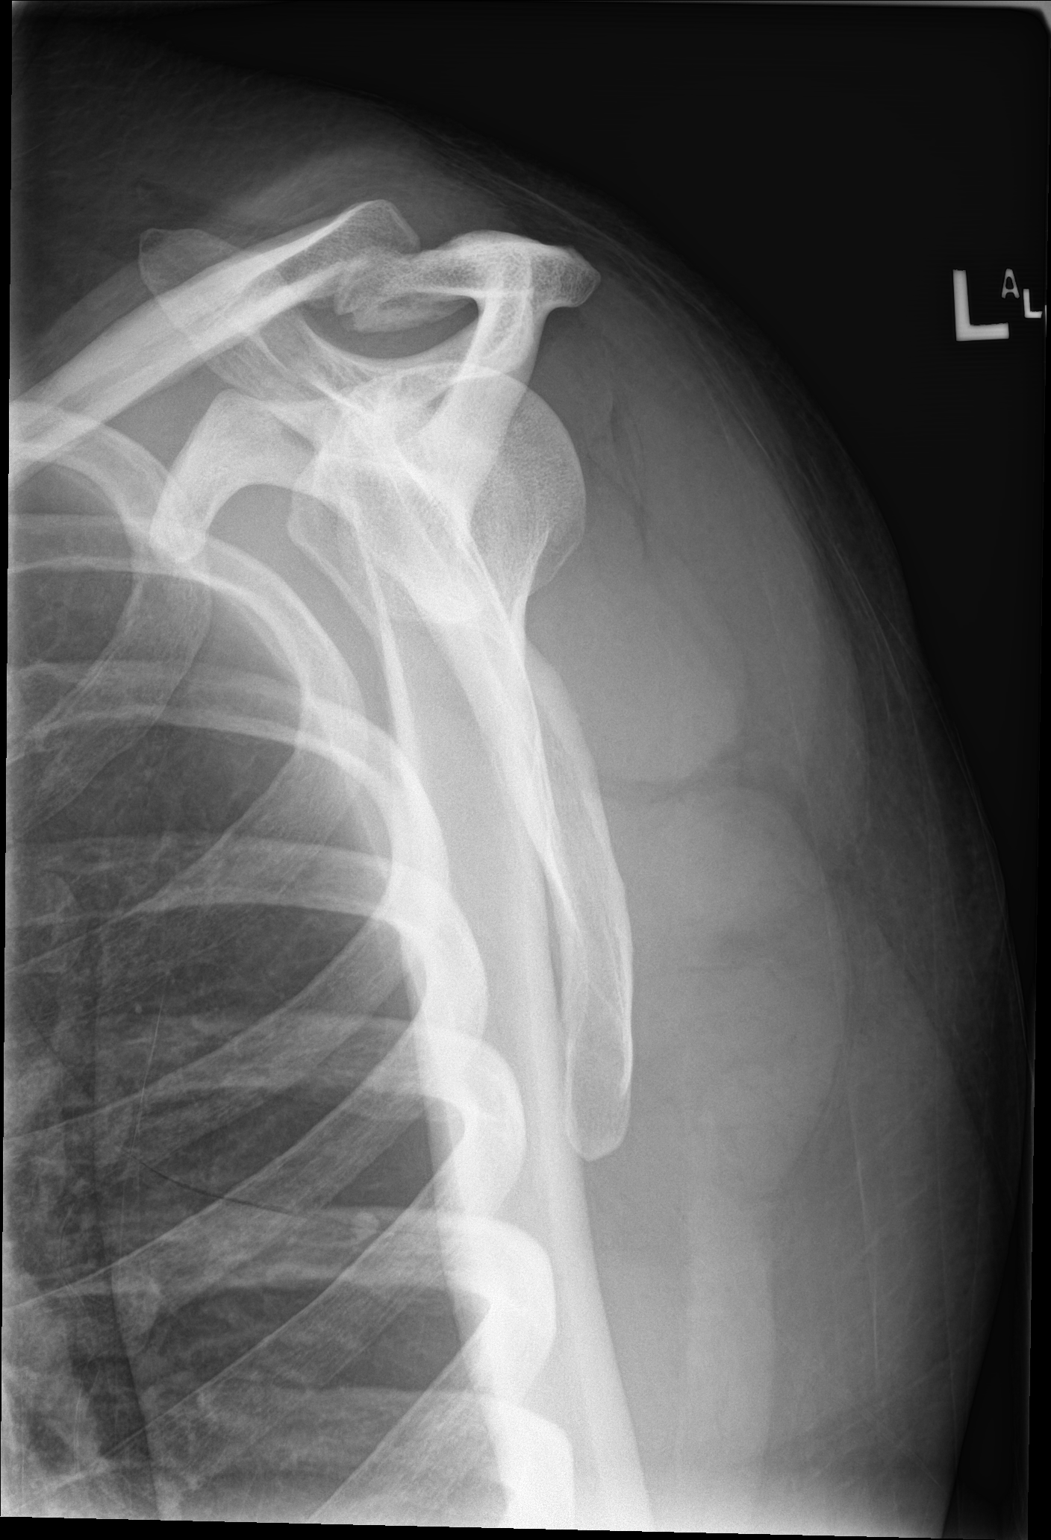

[shoulder axial]
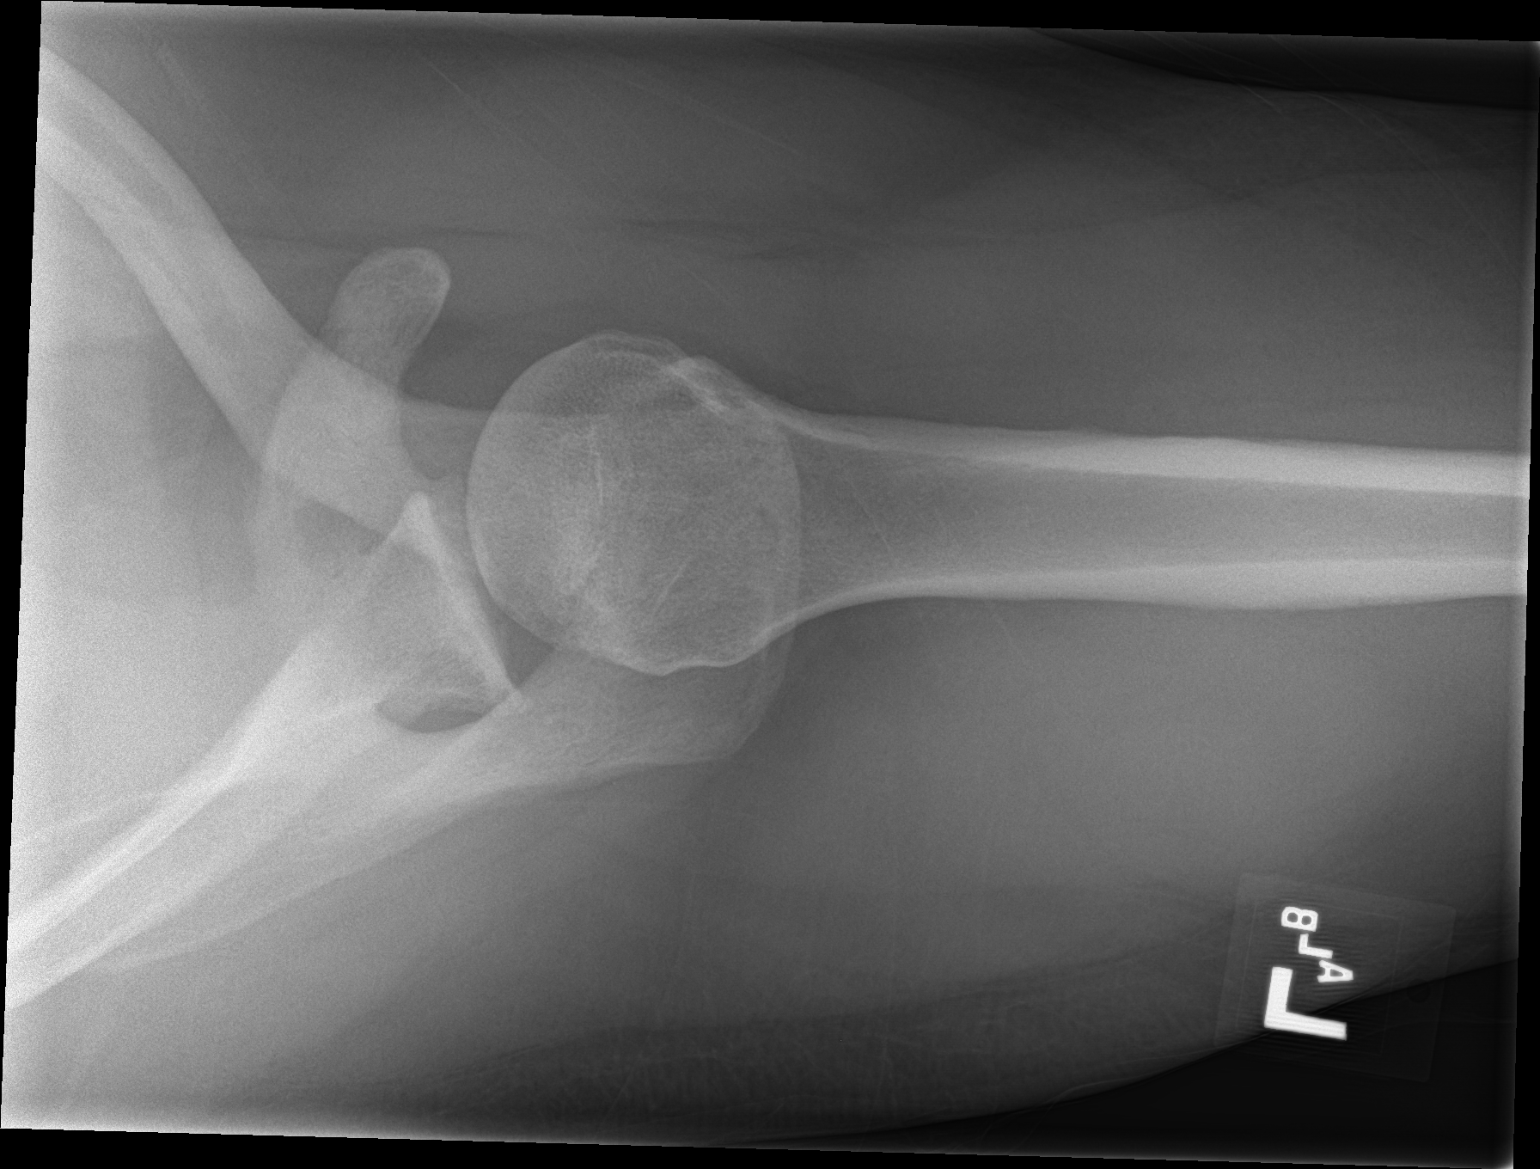

[3 of 3 positions shown; findings below may reference images not displayed]

FINDINGS: There is no evidence of fracture or dislocation. There is no
evidence of arthropathy or other focal bone abnormality. Soft
tissues are unremarkable.
IMPRESSION: Negative.

## 2019-01-15 ENCOUNTER — Other Ambulatory Visit: Payer: Self-pay

## 2019-01-15 ENCOUNTER — Encounter: Payer: Self-pay | Admitting: Family Medicine

## 2019-01-15 ENCOUNTER — Ambulatory Visit: Payer: 59 | Admitting: Family Medicine

## 2019-01-15 ENCOUNTER — Ambulatory Visit: Payer: Self-pay | Admitting: Family Medicine

## 2019-01-15 VITALS — BP 128/82 | HR 60 | Temp 99.0°F | Resp 16 | Wt 239.0 lb

## 2019-01-15 DIAGNOSIS — E876 Hypokalemia: Secondary | ICD-10-CM

## 2019-01-15 DIAGNOSIS — I10 Essential (primary) hypertension: Secondary | ICD-10-CM | POA: Diagnosis not present

## 2019-01-15 DIAGNOSIS — M7711 Lateral epicondylitis, right elbow: Secondary | ICD-10-CM

## 2019-01-15 DIAGNOSIS — Z86010 Personal history of colonic polyps: Secondary | ICD-10-CM | POA: Diagnosis not present

## 2019-01-15 DIAGNOSIS — E119 Type 2 diabetes mellitus without complications: Secondary | ICD-10-CM | POA: Diagnosis not present

## 2019-01-15 LAB — POCT GLYCOSYLATED HEMOGLOBIN (HGB A1C)
Est. average glucose Bld gHb Est-mCnc: 169
Hemoglobin A1C: 7.5 % — AB (ref 4.0–5.6)

## 2019-01-15 NOTE — Progress Notes (Signed)
Patient: Douglas Murray Male    DOB: 1971/06/20   48 y.o.   MRN: [id number] Visit Date: 01/15/2019  Today's Provider: Lelon Huh, MD   Chief Complaint  Patient presents with  . Diabetes  . Hypertension  . hypokalemia   Subjective:     HPI  Diabetes Mellitus Type II, Follow-up:   Lab Results  Component Value Date   HGBA1C 7.5 (A) 01/15/2019   HGBA1C 8.3 (A) 09/29/2018   HGBA1C 7.9 (A) 05/19/2018   BP Readings from Last 3 Encounters:  09/29/18 132/90  05/19/18 120/80  11/17/17 110/80   Last seen for diabetes 3 months ago.  Management since then includes increasing Invokana to 300mg  daily. He reports good compliance with treatment. He is not having side effects.  Current symptoms include none and have been stable. Home blood sugar records: not being checked  Episodes of hypoglycemia? no   Current Insulin Regimen: none Most Recent Eye Exam: up to date Weight trend: stable Prior visit with dietician: No Current exercise: no regular exercise Current diet habits: well balanced  Pertinent Labs:    Component Value Date/Time   CHOL 158 09/29/2018 0918   TRIG 164 (H) 09/29/2018 0918   HDL 30 (L) 09/29/2018 0918   LDLCALC 95 09/29/2018 0918   CREATININE 1.28 (H) 09/29/2018 0918    Wt Readings from Last 3 Encounters:  01/15/19 239 lb (108.4 kg)  09/29/18 242 lb (109.8 kg)  05/19/18 244 lb (110.7 kg)    Hypokalemia, follow up: Patient was last seen in the office 3 months ago. He was advised to start kdur 3meq once daily for low potassium levels at 3.1. CMP     Component Value Date/Time   NA 141 09/29/2018 0918   K 3.1 (L) 09/29/2018 0918   CL 98 09/29/2018 0918   CO2 27 09/29/2018 0918   GLUCOSE 158 (H) 09/29/2018 0918   BUN 12 09/29/2018 0918   CREATININE 1.28 (H) 09/29/2018 0918   CALCIUM 9.9 09/29/2018 0918   PROT 7.3 09/29/2018 0918   ALBUMIN 4.6 09/29/2018 0918   AST 24 09/29/2018 0918   ALT 39 09/29/2018 0918   ALKPHOS 67  09/29/2018 0918   BILITOT 0.6 09/29/2018 0918   GFRNONAA 67 09/29/2018 0918   GFRAA 77 09/29/2018 0918   He also states his right elbow has been sore for several weeks. No know specific injury. Not taking anything for it.   No Known Allergies   Current Outpatient Medications:  .  atenolol-chlorthalidone (TENORETIC) 50-25 MG tablet, TAKE 1 TABLET BY MOUTH  DAILY, Disp: 90 tablet, Rfl: 4 .  canagliflozin (INVOKANA) 300 MG TABS tablet, Take 1 tablet (300 mg total) by mouth daily., Disp: 30 tablet, Rfl: 3 .  potassium chloride SA (K-DUR,KLOR-CON) 20 MEQ tablet, Take 1 tablet (20 mEq total) by mouth daily., Disp: 90 tablet, Rfl: 3 .  pravastatin (PRAVACHOL) 80 MG tablet, TAKE 1 TABLET BY MOUTH  DAILY, Disp: 90 tablet, Rfl: 4 .  metFORMIN (GLUCOPHAGE-XR) 500 MG 24 hr tablet, TAKE 2 TABLETS (1,000 MG TOTAL) BY MOUTH DAILY WITH BREAKFAST FOR 15 DAYS., Disp: 30 tablet, Rfl: 11  Review of Systems  Constitutional: Negative for activity change, appetite change, chills, fatigue and fever.  Respiratory: Negative for cough and shortness of breath.   Cardiovascular: Negative for chest pain, palpitations and leg swelling.  Endocrine: Negative for cold intolerance, heat intolerance, polydipsia, polyphagia and polyuria.  Musculoskeletal: Negative for arthralgias, gait problem, joint swelling and  myalgias.  Neurological: Negative for dizziness, light-headedness and headaches.  Psychiatric/Behavioral: Negative for agitation, self-injury, sleep disturbance and suicidal ideas.    Social History   Tobacco Use  . Smoking status: Never Smoker  . Smokeless tobacco: Never Used  Substance Use Topics  . Alcohol use: No      Objective:   BP 128/82   Pulse 60   Temp 99 F (37.2 C)   Resp 16   Wt 239 lb (108.4 kg)   SpO2 98%   BMI 33.33 kg/m  Vitals:   01/15/19 0929  BP: 128/82  Pulse: 60  Resp: 16  Temp: 99 F (37.2 C)  SpO2: 98%  Weight: 239 lb (108.4 kg)     Physical Exam  General  appearance: alert, well developed, well nourished, cooperative and in no distress Head: Normocephalic, without obvious abnormality, atraumatic Respiratory: Respirations even and unlabored, normal respiratory rate Extremities: Tender right lateral epicondyle, pain with extension and supination.  Skin: Skin color, texture   Results for orders placed or performed in visit on 01/15/19  POCT glycosylated hemoglobin (Hb A1C)  Result Value Ref Range   Hemoglobin A1C 7.5 (A) 4.0 - 5.6 %   HbA1c POC (<> result, manual entry)     HbA1c, POC (prediabetic range)     HbA1c, POC (controlled diabetic range)     Est. average glucose Bld gHb Est-mCnc 169        Assessment & Plan    1. Type 2 diabetes mellitus without complication, without long-term current use of insulin (HCC) Much better with increased dose of Invokana - Renal function panel  2. Hypokalemia On oral potassium supplement - Renal function panel  3. Essential (primary) hypertension Well controlled.  Continue current medications.    4. Personal history of colonic polyps Reviewed record, due for colonoscopy in 2023.   5. Epicondylitis, lateral, right Counseled regarding regular ice application, wearing OTC tennis elbow strap, relative rest and occasional NSAIDs. Advised we could refer to OT If becomes more bothersome or persistent.   The entirety of the information documented in the History of Present Illness, Review of Systems and Physical Exam were personally obtained by me. Portions of this information were initially documented by Wilburt Finlay, CMA and reviewed by me for thoroughness and accuracy.      Lelon Huh, MD  [facility]

## 2019-01-15 NOTE — Patient Instructions (Addendum)
.   Please review the attached list of medications and notify my office if there are any errors.    Please bring all of your medications to every appointment so we can make sure that our medication list is the same as yours.    Epicondylitis Epicondylitis is swelling (inflammation) in your outer forearm, near your elbow. Swelling affects the tissues that connect muscle to bone (tendons). Tennis elbow can happen in any sport or job in which you use your elbow too much. It is caused by doing the same motion over and over. Tennis elbow can cause:  Pain and tenderness in your forearm and the outer part of your elbow. You may have pain all the time, or only when using the arm.  A burning feeling. This runs from your elbow through your arm.  Weak grip in your hand. Follow these instructions at home: Activity  Rest your elbow and wrist. Avoid activities that cause problems, as told by your doctor.  If told by your doctor, wear an elbow strap to reduce stress on the area.  Do physical therapy exercises as told.  If you lift an object, lift it with your palm facing up. This is easier on your elbow. Lifestyle  If your tennis elbow is caused by sports, check your equipment and make sure that: ? You are using it correctly. ? It fits you well.  If your tennis elbow is caused by work or by using a computer, take breaks often to stretch your arm. Talk with your manager about how you can manage your condition at work. If you have a brace:  Wear the brace as told by your doctor. Remove it only as told by your doctor.  Loosen the brace if your fingers tingle, get numb, or turn cold and blue.  Keep the brace clean.  If the brace is not waterproof, ask your doctor if you may take the brace off for bathing. If you must keep the brace on while bathing: ? Do not let it get wet. ? Cover it with a watertight covering when you take a bath or a shower. General instructions   If told, put ice on the  painful area: ? Put ice in a plastic bag. ? Place a towel between your skin and the bag. ? Leave the ice on for 20 minutes, 2-3 times a day.  Take over-the-counter and prescription medicines only as told by your doctor.  Keep all follow-up visits as told by your doctor. This is important. Contact a doctor if:  Your pain does not get better with treatment.  Your pain gets worse.  You have weakness in your forearm, hand, or fingers.  You cannot feel your forearm, hand, or fingers. Summary  Tennis elbow is swelling (inflammation) in your outer forearm, near your elbow.  Tennis elbow is caused by doing the same motion over and over.  Rest your elbow and wrist. Avoid activities that cause problems, as told by your doctor.  If told, put ice on the painful area for 20 minutes, 2-3 times a day. This information is not intended to replace advice given to you by your health care provider. Make sure you discuss any questions you have with your health care provider. Document Released: 12/26/2009 Document Revised: 04/22/2017 Document Reviewed: 04/22/2017 Elsevier Interactive Patient Education  2019 [employer].

## 2019-01-16 LAB — RENAL FUNCTION PANEL
Albumin: 4.8 g/dL (ref 4.0–5.0)
BUN/Creatinine Ratio: 9 (ref 9–20)
BUN: 11 mg/dL (ref 6–24)
CO2: 25 mmol/L (ref 20–29)
Calcium: 10.1 mg/dL (ref 8.7–10.2)
Chloride: 100 mmol/L (ref 96–106)
Creatinine, Ser: 1.2 mg/dL (ref 0.76–1.27)
GFR calc Af Amer: 83 mL/min/{1.73_m2} (ref >59)
GFR calc non Af Amer: 72 mL/min/{1.73_m2} (ref >59)
Glucose: 142 mg/dL — ABNORMAL HIGH (ref 65–99)
Phosphorus: 4 mg/dL (ref 2.8–4.1)
Potassium: 3.5 mmol/L (ref 3.5–5.2)
Sodium: 142 mmol/L (ref 134–144)

## 2019-01-18 ENCOUNTER — Telehealth: Payer: Self-pay | Admitting: Family Medicine

## 2019-01-18 NOTE — Telephone Encounter (Signed)
-----   Message from Birdie Sons, MD sent at 01/17/2019  9:14 AM EDT ----- Labs good. Continue current meds. Schedule follow up for diabetes 5-6 months.

## 2019-01-18 NOTE — Telephone Encounter (Signed)
Spoke with pt to relay this message. F/u appt scheduled for Nov. 30th

## 2019-01-23 ENCOUNTER — Other Ambulatory Visit: Payer: Self-pay | Admitting: Family Medicine

## 2019-03-05 ENCOUNTER — Telehealth: Payer: Self-pay | Admitting: Family Medicine

## 2019-03-05 DIAGNOSIS — E119 Type 2 diabetes mellitus without complications: Secondary | ICD-10-CM

## 2019-03-05 MED ORDER — CANAGLIFLOZIN 300 MG PO TABS: 300.0000 mg | ORAL_TABLET | Freq: Every day | ORAL | 3 refills | Status: DC

## 2019-03-05 NOTE — Telephone Encounter (Signed)
Pt needing refills on:  canagliflozin (INVOKANA) 300 MG TABS tablet  Please fill at:  [id number] - [place], [place] [phone] (Phone) [phone] (Fax)   Thanks, [employer]

## 2019-03-05 NOTE — Telephone Encounter (Signed)
Rx filled today 

## 2019-04-05 ENCOUNTER — Telehealth: Payer: Self-pay | Admitting: Family Medicine

## 2019-04-05 NOTE — Telephone Encounter (Signed)
Pt received a letter from his insurance company regarding canagliflozin (INVOKANA) 300 MG TABS tablet.  They will no longer the name brand of INVOKANA .   Pt needing refill.   He uses: [id number] [address], [place] [phone] (Phone) [phone] (Fax)   Thanks, [employer]

## 2019-04-05 NOTE — Telephone Encounter (Signed)
Invokana does not have generic. Did they list a preferred alternative such as Wilder Glade, Catering manager, or [place].

## 2019-04-06 NOTE — Telephone Encounter (Signed)
[id number] 

## 2019-04-09 ENCOUNTER — Telehealth: Payer: Self-pay | Admitting: Family Medicine

## 2019-04-09 NOTE — Telephone Encounter (Signed)
[facility]'s other medication to be filled inplace of the canagliflozin (INVOKANA) 300 MG TABS tablet .    [id number] [address], [place] [phone] (Phone) [phone] (Fax)   Pt wanting this new medication to be approved and filled asap.  Thanks, [employer]

## 2019-04-09 NOTE — Telephone Encounter (Signed)
Dr. Caryn Section, did you see anything come through for alternative meds for this patient from the pharmacy? I have not seen anything yet.

## 2019-04-09 NOTE — Telephone Encounter (Signed)
Pt called saying the pharmacy is supposed to get in contact with [place] about which one he can use   teri

## 2019-04-12 MED ORDER — JARDIANCE 25 MG PO TABS: 25.0000 mg | ORAL_TABLET | Freq: Every day | ORAL | 3 refills | Status: DC

## 2019-04-12 NOTE — Telephone Encounter (Signed)
See previous telephone encounter. KW 

## 2019-04-12 NOTE — Telephone Encounter (Signed)
Pharmacy noted that pt's insurance will no longer cover canagliflozin (INVOKANA) 300 MG TABS tablet and are requesting a new Rx for Jardiance b/c it is now on formulary. Please advise. Thanks TNP

## 2019-04-12 NOTE — Telephone Encounter (Signed)
Please review message below and advise. KW 

## 2019-04-15 ENCOUNTER — Telehealth: Payer: Self-pay | Admitting: Family Medicine

## 2019-06-12 ENCOUNTER — Other Ambulatory Visit: Payer: Self-pay | Admitting: Family Medicine

## 2019-06-16 NOTE — Progress Notes (Signed)
Patient: Douglas Murray Male    DOB: Dec 30, 1970   48 y.o.   MRN: [id number] Visit Date: 06/21/2019  Today's Provider: Lelon Huh, MD   Chief Complaint  Patient presents with  . Diabetes  . Hypertension  . Hypokalemia   Subjective:     HPI  Diabetes Mellitus Type II, Follow-up:   Lab Results  Component Value Date   HGBA1C 7.5 (A) 01/15/2019   HGBA1C 8.3 (A) 09/29/2018   HGBA1C 7.9 (A) 05/19/2018    Last seen for diabetes 5 months ago.  Management since then includes no changes. He reports good compliance with treatment. He is not having side effects.  Current symptoms include none  Home blood sugar records: fasting range: 160-170's  Episodes of hypoglycemia? no   Current insulin regiment: Is not on insulin Most Recent Eye Exam: not UTD Weight trend: stable Prior visit with dietician: No Current exercise: walking Current diet habits: in general, an "unhealthy" diet  Pertinent Labs:    Component Value Date/Time   CHOL 158 09/29/2018 0918   TRIG 164 (H) 09/29/2018 0918   HDL 30 (L) 09/29/2018 0918   LDLCALC 95 09/29/2018 0918   CREATININE 1.20 01/15/2019 1016    Wt Readings from Last 3 Encounters:  06/21/19 238 lb 12.8 oz (108.3 kg)  01/15/19 239 lb (108.4 kg)  09/29/18 242 lb (109.8 kg)    ------------------------------------------------------------------------  Follow up for Hypokalemia:  The patient was last seen for this 5 months ago. Changes made at last visit include none; continue potassium supplement.  He reports good compliance with treatment. He feels that condition is Unchanged. He is not having side effects.   ------------------------------------------------------------------------------------  Hypertension, follow-up:  BP Readings from Last 3 Encounters:  06/21/19 135/83  01/15/19 128/82  09/29/18 132/90    He was last seen for hypertension 5 months ago.  BP at that visit was 128/82. Management since that  visit includes no changes. He reports good compliance with treatment. He is not having side effects.  He is exercising. He is adherent to low salt diet.   Outside blood pressures are not being checked. He is experiencing none.  Patient denies chest pain, chest pressure/discomfort, irregular heart beat and palpitations.   Cardiovascular risk factors include hypertension and male gender.  Use of agents associated with hypertension: none.     Weight trend: stable Wt Readings from Last 3 Encounters:  06/21/19 238 lb 12.8 oz (108.3 kg)  01/15/19 239 lb (108.4 kg)  09/29/18 242 lb (109.8 kg)    Current diet: in general, an "unhealthy" diet  ------------------------------------------------------------------------  Also having some neck pain radiating into left arm off an on for the last couple of weeks. No taken any OTC medications.   No Known Allergies   Current Outpatient Medications:  .  atenolol-chlorthalidone (TENORETIC) 50-25 MG tablet, TAKE 1 TABLET BY MOUTH  DAILY, Disp: 90 tablet, Rfl: 1 .  metFORMIN (GLUCOPHAGE-XR) 500 MG 24 hr tablet, TAKE 2 TABLETS BY MOUTH  DAILY WITH BREAKFAST, Disp: 180 tablet, Rfl: 4 .  potassium chloride SA (K-DUR,KLOR-CON) 20 MEQ tablet, Take 1 tablet (20 mEq total) by mouth daily., Disp: 90 tablet, Rfl: 3 .  pravastatin (PRAVACHOL) 80 MG tablet, TAKE 1 TABLET BY MOUTH  DAILY, Disp: 90 tablet, Rfl: 4 .  canagliflozin (INVOKANA) 300 MG TABS tablet, Take 1 tablet (300 mg total) by mouth daily. (Patient not taking: Reported on 06/21/2019), Disp: 30 tablet, Rfl: 3 .  JARDIANCE 25  MG TABS tablet, Take 25 mg by mouth every morning., Disp: , Rfl:   Review of Systems  Constitutional: Negative for appetite change, chills and fever.  Respiratory: Negative for chest tightness, shortness of breath and wheezing.   Cardiovascular: Negative for chest pain and palpitations.  Gastrointestinal: Negative for abdominal pain, nausea and vomiting.    Social History    Tobacco Use  . Smoking status: Never Smoker  . Smokeless tobacco: Never Used  Substance Use Topics  . Alcohol use: No      Objective:   BP 135/83 (BP Location: Right Arm, Patient Position: Sitting, Cuff Size: Large)   Pulse 63   Temp (!) 96.8 F (36 C) (Temporal)   Wt 238 lb 12.8 oz (108.3 kg)   BMI 33.31 kg/m  Vitals:   06/21/19 0954  BP: 135/83  Pulse: 63  Temp: (!) 96.8 F (36 C)  TempSrc: Temporal  Weight: 238 lb 12.8 oz (108.3 kg)  Body mass index is 33.31 kg/m.   Physical Exam   General Appearance:    Obese male in no acute distress  Eyes:    PERRL, conjunctiva/corneas clear, EOM's intact       Lungs:     Clear to auscultation bilaterally, respirations unlabored  Heart:    Normal heart rate. Normal rhythm. No murmurs, rubs, or gallops.   MS:   All extremities are intact.   Neurologic:   Awake, alert, oriented x 3. No apparent focal neurological           defect.        Results for orders placed or performed in visit on 06/21/19  POCT HgB A1C  Result Value Ref Range   Hemoglobin A1C 7.8 (A) 4.0 - 5.6 %   Est. average glucose Bld gHb Est-mCnc 177        Assessment & Plan    1. Type 2 diabetes mellitus without complication, without long-term current use of insulin (HCC) A1c [place] up somewhat since change from Invokana to [place]. Encouraged improving diet and increase physical activity.  - JARDIANCE 25 MG TABS tablet; Take 25 mg by mouth every morning.  2. Numbness and tingling in left arm Is improving. Likely some nerve impingement after muscle strain. Can take occasionally OTC Aleve and call if not much better in a few weeks.   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    4. Essential (primary) hypertension Well controlled.  Continue current medications.    5. Need for influenza vaccination  - Flu Vaccine QUAD 6+ mos PF IM (Fluarix Quad PF)  Future Appointments  Date Time Provider [place]  10/18/2019   9:40 AM Caryn Section, Kirstie Peri, MD [facility]    The entirety of the information documented in the History of Present Illness, Review of Systems and Physical Exam were personally obtained by me. Portions of this information were initially documented by Idelle Jo, CMA and reviewed by me for thoroughness and accuracy.     Lelon Huh, MD  [facility]

## 2019-06-21 ENCOUNTER — Other Ambulatory Visit: Payer: Self-pay

## 2019-06-21 ENCOUNTER — Ambulatory Visit: Payer: 59 | Admitting: Family Medicine

## 2019-06-21 ENCOUNTER — Encounter: Payer: Self-pay | Admitting: Family Medicine

## 2019-06-21 ENCOUNTER — Telehealth: Payer: Self-pay

## 2019-06-21 VITALS — BP 135/83 | HR 63 | Temp 96.8°F | Wt 238.8 lb

## 2019-06-21 DIAGNOSIS — E881 Lipodystrophy, not elsewhere classified: Secondary | ICD-10-CM | POA: Diagnosis not present

## 2019-06-21 DIAGNOSIS — R200 Anesthesia of skin: Secondary | ICD-10-CM | POA: Diagnosis not present

## 2019-06-21 DIAGNOSIS — E119 Type 2 diabetes mellitus without complications: Secondary | ICD-10-CM | POA: Diagnosis not present

## 2019-06-21 DIAGNOSIS — I10 Essential (primary) hypertension: Secondary | ICD-10-CM | POA: Diagnosis not present

## 2019-06-21 DIAGNOSIS — R202 Paresthesia of skin: Secondary | ICD-10-CM

## 2019-06-21 DIAGNOSIS — Z23 Encounter for immunization: Secondary | ICD-10-CM

## 2019-06-21 LAB — POCT GLYCOSYLATED HEMOGLOBIN (HGB A1C)
Est. average glucose Bld gHb Est-mCnc: 177
Hemoglobin A1C: 7.8 % — AB (ref 4.0–5.6)

## 2019-06-21 NOTE — Patient Instructions (Addendum)
.   Please review the attached list of medications and notify my office if there are any errors.   . Please bring all of your medications to every appointment so we can make sure that our medication list is the same as yours.   Please contact your eyecare professional to schedule a routine eye exam   Try taking 2 Aleve every night at bedtime for the next two weeks to see if will help with neck pain and tingling in your arm

## 2019-06-21 NOTE — Telephone Encounter (Signed)
Copied from [place] [id number]. Topic: Clinical - COVID Pre-Screen >> Jun 18, 2019  3:22 PM Mathis Bud wrote: 1. To the best of your knowledge, have you been in close contact with anyone with a confirmed diagnosis of COVID 19?    NO 2. Have you had any one or more of the following: fever, chills, cough, shortness of breath or any flu-like symptoms?  NO  3. Have you been diagnosed with or have a previous diagnosis of COVID 19?  NO  4. I am going to go over a few other symptoms with you. Please let me know if you are experiencing any of the following:  Ear, nose or throat discomfort  A sore throat  Headache - yes headaches   Muscle pain  Diarrhea- yes diarrhea   Loss of taste or smell  >> Jun 18, 2019  3:26 PM Mathis Bud wrote: Did patient covid screening, patient did have diarrhea and headaches.   Call back [phone]

## 2019-10-15 NOTE — Progress Notes (Signed)
Established patient visit      Patient: Douglas Murray   DOB: [birth year]   49 y.o. Male  MRN: [id number] Visit Date: 10/18/2019  Today's healthcare provider: Lelon Huh, MD  Subjective:    Chief Complaint  Patient presents with  . Diabetes  . Hyperlipidemia  . Hypertension   HPI  Diabetes Mellitus Type II, Follow-up:   Lab Results  Component Value Date   HGBA1C 7.6 (A) 10/18/2019   HGBA1C 7.8 (A) 06/21/2019   HGBA1C 7.5 (A) 01/15/2019    Last seen for diabetes 4 months ago.  Management since then includes encouraging patient to improve diet and increase physical activity. Marland Kitchen He reports good compliance with treatment. He is not having side effects.   Home blood sugar records: fasting range: 180's  Episodes of hypoglycemia? no   Current insulin regiment: Is not on insulin Most Recent Eye Exam: < 1 year ago Weight trend: fluctuating a bit Prior visit with dietician: No Current exercise: walking Current diet habits: in general, an "unhealthy" diet He does complain of numbness and tingling in his feet and toes get irritated after walking around in his working shoes all day.  Pertinent Labs:    Component Value Date/Time   CHOL 158 09/29/2018 0918   TRIG 164 (H) 09/29/2018 0918   HDL 30 (L) 09/29/2018 0918   LDLCALC 95 09/29/2018 0918   CREATININE 1.20 01/15/2019 1016    Wt Readings from Last 3 Encounters:  10/18/19 235 lb (106.6 kg)  06/21/19 238 lb 12.8 oz (108.3 kg)  01/15/19 239 lb (108.4 kg)    ------------------------------------------------------------------------   Hypertension, follow-up:  BP Readings from Last 3 Encounters:  10/18/19 122/80  06/21/19 135/83  01/15/19 128/82    He was last seen for hypertension 4 months ago.  BP at that visit was 135/83. Management since that visit includes no changes. He reports good compliance with treatment. He is not having side effects.  He is exercising. He is adherent to low salt  diet.   Outside blood pressures are not checked.  Cardiovascular risk factors include hypertension and male gender.  Use of agents associated with hypertension: none.     Weight trend: fluctuating a bit Wt Readings from Last 3 Encounters:  10/18/19 235 lb (106.6 kg)  06/21/19 238 lb 12.8 oz (108.3 kg)  01/15/19 239 lb (108.4 kg)    Current diet: in general, an "unhealthy" diet  ------------------------------------------------------------------------  Lipid/Cholesterol, Follow-up:   Last seen for this4 months ago.  Management changes since that visit include none. . Last Lipid Panel:    Component Value Date/Time   CHOL 158 09/29/2018 0918   TRIG 164 (H) 09/29/2018 0918   HDL 30 (L) 09/29/2018 0918   CHOLHDL 5.3 (H) 09/29/2018 0918   LDLCALC 95 09/29/2018 0918    Risk factors for vascular disease include diabetes mellitus, hypercholesterolemia and hypertension  He reports good compliance with treatment. He is not having side effects.  Current symptoms include none and have been stable. Weight trend: fluctuating a bit Prior visit with dietician: no Current diet: in general, an "unhealthy" diet Current exercise: walking  Wt Readings from Last 3 Encounters:  10/18/19 235 lb (106.6 kg)  06/21/19 238 lb 12.8 oz (108.3 kg)  01/15/19 239 lb (108.4 kg)    -------------------------------------------------------------------     Medications: Outpatient Medications Prior to Visit  Medication Sig  . atenolol-chlorthalidone TAKE 1 TABLET BY MOUTH  DAILY  . Jardiance Take 25 mg by mouth every  morning.  . metFORMIN TAKE 2 TABLETS BY MOUTH  DAILY WITH BREAKFAST  . potassium chloride SA Take 1 tablet (20 mEq total) by mouth daily.  . pravastatin TAKE 1 TABLET BY MOUTH  DAILY   No facility-administered medications prior to visit.    Review of Systems  Constitutional: Negative for appetite change, chills and fever.  Respiratory: Negative for chest tightness, shortness of  breath and wheezing.   Cardiovascular: Negative for chest pain and palpitations.  Gastrointestinal: Negative for abdominal pain, nausea and vomiting.  Neurological: Positive for headaches.        Objective:    BP 122/80 (BP Location: Left Arm, Patient Position: Sitting, Cuff Size: Large)   Pulse (!) 54   Temp (!) 96.8 F (36 C) (Temporal)   Resp 16   Wt 235 lb (106.6 kg)   SpO2 98% Comment: room air  BMI 32.78 kg/m    Physical Exam   General: Appearance:    Obese male in no acute distress  Eyes:    PERRL, conjunctiva/corneas clear, EOM's intact       Lungs:     Clear to auscultation bilaterally, respirations unlabored  Heart:    Bradycardic. Normal rhythm. No murmurs, rubs, or gallops.   MS:   All extremities are intact.   Neurologic:   Awake, alert, oriented x 3. No apparent focal neurological           defect.        Results for orders placed or performed in visit on 10/18/19  POCT HgB A1C  Result Value Ref Range   Hemoglobin A1C 7.6 (A) 4.0 - 5.6 %   Est. average glucose Bld gHb Est-mCnc 171   POCT UA - Microalbumin  Result Value Ref Range   Microalbumin Ur, POC negative mg/L      Assessment & Plan:    1. Type 2 diabetes mellitus without complication, with neuropathy ([place])   2. Numbness and tingling of both lower extremities  - Vitamin B12  3. Essential (primary) hypertension Well controlled.  Continue current medications.    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    - Comprehensive metabolic panel - Lipid panel    The entirety of the information documented in the History of Present Illness, Review of Systems and Physical Exam were personally obtained by me. Portions of this information were initially documented by Meyer Cory, CMA and reviewed by me for thoroughness and accuracy.    Lelon Huh, MD  [facility] [phone] (phone) [phone] (fax)  [place]

## 2019-10-17 ENCOUNTER — Other Ambulatory Visit: Payer: Self-pay | Admitting: Family Medicine

## 2019-10-17 DIAGNOSIS — E119 Type 2 diabetes mellitus without complications: Secondary | ICD-10-CM

## 2019-10-17 NOTE — Telephone Encounter (Signed)
Requested medications are due for refill today?  Jardiance listed as a historical medication.    Requested medications are on active medication list?  Yes  Last Refill:  Unknown - historical medication  Future visit scheduled?  Yes   Notes to Clinic:  Patient has an office appointment on Monday - 10/18/2019

## 2019-10-18 ENCOUNTER — Ambulatory Visit: Payer: 59 | Admitting: Family Medicine

## 2019-10-18 ENCOUNTER — Other Ambulatory Visit: Payer: Self-pay

## 2019-10-18 ENCOUNTER — Encounter: Payer: Self-pay | Admitting: Family Medicine

## 2019-10-18 VITALS — BP 122/80 | HR 54 | Temp 96.8°F | Resp 16 | Wt 235.0 lb

## 2019-10-18 DIAGNOSIS — I10 Essential (primary) hypertension: Secondary | ICD-10-CM

## 2019-10-18 DIAGNOSIS — R200 Anesthesia of skin: Secondary | ICD-10-CM

## 2019-10-18 DIAGNOSIS — R202 Paresthesia of skin: Secondary | ICD-10-CM

## 2019-10-18 DIAGNOSIS — E1140 Type 2 diabetes mellitus with diabetic neuropathy, unspecified: Secondary | ICD-10-CM | POA: Diagnosis not present

## 2019-10-18 DIAGNOSIS — E881 Lipodystrophy, not elsewhere classified: Secondary | ICD-10-CM

## 2019-10-18 LAB — POCT UA - MICROALBUMIN: Microalbumin Ur, POC: NEGATIVE mg/L

## 2019-10-18 LAB — POCT GLYCOSYLATED HEMOGLOBIN (HGB A1C)
Est. average glucose Bld gHb Est-mCnc: 171
Hemoglobin A1C: 7.6 % — AB (ref 4.0–5.6)

## 2019-10-19 LAB — COMPREHENSIVE METABOLIC PANEL
ALT: 27 IU/L (ref 0–44)
AST: 15 IU/L (ref 0–40)
Albumin/Globulin Ratio: 1.8 (ref 1.2–2.2)
Albumin: 4.4 g/dL (ref 4.0–5.0)
Alkaline Phosphatase: 76 IU/L (ref 39–117)
BUN/Creatinine Ratio: 7 — ABNORMAL LOW (ref 9–20)
BUN: 9 mg/dL (ref 6–24)
Bilirubin Total: 0.5 mg/dL (ref 0.0–1.2)
CO2: 26 mmol/L (ref 20–29)
Calcium: 9.4 mg/dL (ref 8.7–10.2)
Chloride: 101 mmol/L (ref 96–106)
Creatinine, Ser: 1.24 mg/dL (ref 0.76–1.27)
GFR calc Af Amer: 80 mL/min/{1.73_m2} (ref >59)
GFR calc non Af Amer: 69 mL/min/{1.73_m2} (ref >59)
Globulin, Total: 2.4 g/dL (ref 1.5–4.5)
Glucose: 129 mg/dL — ABNORMAL HIGH (ref 65–99)
Potassium: 3.2 mmol/L — ABNORMAL LOW (ref 3.5–5.2)
Sodium: 143 mmol/L (ref 134–144)
Total Protein: 6.8 g/dL (ref 6.0–8.5)

## 2019-10-19 LAB — LIPID PANEL
Chol/HDL Ratio: 4.7 ratio (ref 0.0–5.0)
Cholesterol, Total: 142 mg/dL (ref 100–199)
HDL: 30 mg/dL — ABNORMAL LOW (ref >39)
LDL Chol Calc (NIH): 95 mg/dL (ref 0–99)
Triglycerides: 89 mg/dL (ref 0–149)
VLDL Cholesterol Cal: 17 mg/dL (ref 5–40)

## 2019-10-19 LAB — VITAMIN B12: Vitamin B-12: 594 pg/mL (ref 232–1245)

## 2019-11-08 ENCOUNTER — Other Ambulatory Visit: Payer: Self-pay | Admitting: Family Medicine

## 2019-11-08 NOTE — Telephone Encounter (Signed)
Requested Prescriptions  Pending Prescriptions Disp Refills  . pravastatin (PRAVACHOL) 80 MG tablet [Pharmacy Med Name: PRAVASTATIN SOD 80MG  TABLET] 90 tablet 3    Sig: TAKE 1 TABLET BY MOUTH  DAILY     Cardiovascular:  Antilipid - Statins Failed - 11/08/2019 10:28 PM      Failed - LDL in normal range and within 360 days    LDL Chol Calc (NIH)  Date Value Ref Range Status  10/18/2019 95 0 - 99 mg/dL Final         Failed - HDL in normal range and within 360 days    HDL  Date Value Ref Range Status  10/18/2019 30 (L) >39 mg/dL Final         Passed - Total Cholesterol in normal range and within 360 days    Cholesterol, Total  Date Value Ref Range Status  10/18/2019 142 100 - 199 mg/dL Final         Passed - Triglycerides in normal range and within 360 days    Triglycerides  Date Value Ref Range Status  10/18/2019 89 0 - 149 mg/dL Final         Passed - Patient is not pregnant      Passed - Valid encounter within last 12 months    Recent Outpatient Visits          3 weeks ago Type 2 diabetes mellitus with diabetic neuropathy, without long-term current use of insulin ([place])   [facility] Birdie Sons, MD   4 months ago Type 2 diabetes mellitus without complication, without long-term current use of insulin ([place])   [facility] Birdie Sons, MD   9 months ago Type 2 diabetes mellitus without complication, without long-term current use of insulin ([place])   [facility] Birdie Sons, MD   1 year ago Type 2 diabetes mellitus without complication, without long-term current use of insulin ([place])   [facility] Birdie Sons, MD   1 year ago Type 2 diabetes mellitus without complication, without long-term current use of insulin [facility])   [place], Kirstie Peri, MD      Future Appointments            In 3 months Fisher, Kirstie Peri, MD [facility], [facility]

## 2019-11-10 ENCOUNTER — Other Ambulatory Visit: Payer: Self-pay | Admitting: Family Medicine

## 2019-11-10 DIAGNOSIS — E876 Hypokalemia: Secondary | ICD-10-CM

## 2019-11-10 NOTE — Telephone Encounter (Signed)
Requested Prescriptions  Pending Prescriptions Disp Refills  . KLOR-CON M20 20 MEQ tablet [Pharmacy Med Name: KLOR-CON M20 TABLET] 90 tablet 3    Sig: TAKE 1 TABLET BY MOUTH EVERY DAY     Endocrinology:  Minerals - Potassium Supplementation Failed - 11/10/2019  3:22 AM      Failed - K in normal range and within 360 days    Potassium  Date Value Ref Range Status  10/18/2019 3.2 (L) 3.5 - 5.2 mmol/L Final         Passed - Cr in normal range and within 360 days    Creatinine, Ser  Date Value Ref Range Status  10/18/2019 1.24 0.76 - 1.27 mg/dL Final   Creatinine, POC  Date Value Ref Range Status  09/29/2018 n/a mg/dL Final         Passed - Valid encounter within last 12 months    Recent Outpatient Visits          3 weeks ago Type 2 diabetes mellitus with diabetic neuropathy, without long-term current use of insulin ([place])   [facility] Birdie Sons, MD   4 months ago Type 2 diabetes mellitus without complication, without long-term current use of insulin ([place])   [facility] Birdie Sons, MD   9 months ago Type 2 diabetes mellitus without complication, without long-term current use of insulin ([place])   [facility] Birdie Sons, MD   1 year ago Type 2 diabetes mellitus without complication, without long-term current use of insulin ([place])   [facility] Birdie Sons, MD   1 year ago Type 2 diabetes mellitus without complication, without long-term current use of insulin [facility])   [place], Kirstie Peri, MD      Future Appointments            In 3 months Fisher, Kirstie Peri, MD [facility], [place]

## 2020-02-10 ENCOUNTER — Other Ambulatory Visit: Payer: Self-pay | Admitting: Family Medicine

## 2020-02-11 ENCOUNTER — Other Ambulatory Visit: Payer: Self-pay | Admitting: Family Medicine

## 2020-02-16 NOTE — Progress Notes (Signed)
Established patient visit   Patient: Douglas Murray   DOB: 09-26-1970   49 y.o. Male  MRN: [id number] Visit Date: 02/18/2020  Today's healthcare provider: Lelon Huh, MD   Chief Complaint  Patient presents with  . Diabetes  . Hypertension   [employer] as a scribe for Lelon Huh, MD.,have documented all relevant documentation on the behalf of Lelon Huh, MD,as directed by  Lelon Huh, MD while in the presence of Lelon Huh, MD.  Subjective    HPI  Diabetes Mellitus Type II, Follow-up  Lab Results  Component Value Date   HGBA1C 7.6 (A) 10/18/2019   HGBA1C 7.8 (A) 06/21/2019   HGBA1C 7.5 (A) 01/15/2019   Wt Readings from Last 3 Encounters:  02/18/20 (!) 235 lb 12.8 oz (107 kg)  10/18/19 235 lb (106.6 kg)  06/21/19 238 lb 12.8 oz (108.3 kg)   Last seen for diabetes 4 months ago.  Management since then includes recommend trying OTC alpha-lipoic acid 600mg  three times daily for 1 month for diabetic neuropathy. He reports poor compliance with treatment. He is not having side effects.  Symptoms: No fatigue No foot ulcerations  No appetite changes No nausea  No paresthesia of the feet  No polydipsia  No polyuria No visual disturbances   No vomiting     Home blood sugar records: fasting range: 170-180's  Episodes of hypoglycemia? No    Current insulin regiment: none Most Recent Eye Exam: not UTD Current exercise: none Current diet habits: in general, an "unhealthy" diet  Pertinent Labs: Lab Results  Component Value Date   CHOL 142 10/18/2019   HDL 30 (L) 10/18/2019   LDLCALC 95 10/18/2019   TRIG 89 10/18/2019   CHOLHDL 4.7 10/18/2019   Lab Results  Component Value Date   NA 143 10/18/2019   K 3.2 (L) 10/18/2019   CREATININE 1.24 10/18/2019   GFRNONAA 69 10/18/2019   GFRAA 80 10/18/2019   GLUCOSE 129 (H) 10/18/2019      ---------------------------------------------------------------------------------------------------  Hypertension, follow-up  BP Readings from Last 3 Encounters:  02/18/20 (!) 130/84  10/18/19 122/80  06/21/19 135/83   Wt Readings from Last 3 Encounters:  02/18/20 (!) 235 lb 12.8 oz (107 kg)  10/18/19 235 lb (106.6 kg)  06/21/19 238 lb 12.8 oz (108.3 kg)     He was last seen for hypertension 4 months ago.  BP at that visit was 122/80. Management since that visit includes continue same medication.  He reports good compliance with treatment. He is not having side effects.  He is following a Regular diet. He is not exercising. He does not smoke.  Use of agents associated with hypertension: none.   Outside blood pressures are not being checked at home. Symptoms: No chest pain No chest pressure  No palpitations No syncope  No dyspnea No orthopnea  No paroxysmal nocturnal dyspnea No lower extremity edema   Pertinent labs: Lab Results  Component Value Date   CHOL 142 10/18/2019   HDL 30 (L) 10/18/2019   LDLCALC 95 10/18/2019   TRIG 89 10/18/2019   CHOLHDL 4.7 10/18/2019   Lab Results  Component Value Date   NA 143 10/18/2019   K 3.2 (L) 10/18/2019   CREATININE 1.24 10/18/2019   GFRNONAA 69 10/18/2019   GFRAA 80 10/18/2019   GLUCOSE 129 (H) 10/18/2019     The 10-year ASCVD risk score Mikey Bussing DC Jr., et al., 2013) is: 15.1%   ---------------------------------------------------------------------------------------------------     Medications: Outpatient  Medications Prior to Visit  Medication Sig  . atenolol-chlorthalidone (TENORETIC) 50-25 MG tablet TAKE 1 TABLET BY MOUTH  DAILY  . empagliflozin (JARDIANCE) 25 MG TABS tablet Take 25 mg by mouth daily.  Marland Kitchen KLOR-CON M20 20 MEQ tablet TAKE 1 TABLET BY MOUTH EVERY DAY  . metFORMIN (GLUCOPHAGE-XR) 500 MG 24 hr tablet TAKE 2 TABLETS BY MOUTH  DAILY WITH BREAKFAST  . pravastatin (PRAVACHOL) 80 MG tablet TAKE 1 TABLET BY  MOUTH  DAILY   No facility-administered medications prior to visit.    Review of Systems  Constitutional: Negative.   Respiratory: Negative.   Cardiovascular: Negative.   Endocrine: Negative.   Musculoskeletal: Negative.       Objective    BP (!) 130/84 (BP Location: Right Arm, Patient Position: Sitting, Cuff Size: Large)   Pulse 56   Temp 98.4 F (36.9 C) (Oral)   Wt (!) 235 lb 12.8 oz (107 kg)   BMI 32.89 kg/m    Physical Exam   General: Appearance:    Obese male in no acute distress  Eyes:    PERRL, conjunctiva/corneas clear, EOM's intact       Lungs:     Clear to auscultation bilaterally, respirations unlabored  Heart:    Bradycardic. Normal rhythm. No murmurs, rubs, or gallops.   MS:   All extremities are intact.   Neurologic:   Awake, alert, oriented x 3. No apparent focal neurological           defect.        No results found for any visits on 02/18/20.  Assessment & Plan     1. Type 2 diabetes mellitus with diabetic neuropathy, without long-term current use of insulin (HCC) Improving on current dose of metformin and Jardiance. Continue current medications.  He is going to work on increase physical activity. Recheck A1c in 3-4 months.   2. Essential (primary) hypertension Well controlled.  Continue current medications.    3. Hypokalemia He has not yet started potassium supplement. Last K was 3.2 on 10/18/2019.  - potassium chloride SA (KLOR-CON M20) 20 MEQ tablet; Take 1 tablet (20 mEq total) by mouth daily.  Dispense: 90 tablet; Refill: 3  Anticipating checking potassium at next visit in 3-4 months.         The entirety of the information documented in the History of Present Illness, Review of Systems and Physical Exam were personally obtained by me. Portions of this information were initially documented by the CMA and reviewed by me for thoroughness and accuracy.      Lelon Huh, MD  [facility] [phone] (phone) [phone]  (fax)  [place]

## 2020-02-18 ENCOUNTER — Ambulatory Visit: Payer: 59 | Admitting: Family Medicine

## 2020-02-18 ENCOUNTER — Encounter: Payer: Self-pay | Admitting: Family Medicine

## 2020-02-18 ENCOUNTER — Other Ambulatory Visit: Payer: Self-pay

## 2020-02-18 VITALS — BP 130/84 | HR 56 | Temp 98.4°F | Wt 235.8 lb

## 2020-02-18 DIAGNOSIS — E876 Hypokalemia: Secondary | ICD-10-CM

## 2020-02-18 DIAGNOSIS — E1140 Type 2 diabetes mellitus with diabetic neuropathy, unspecified: Secondary | ICD-10-CM

## 2020-02-18 DIAGNOSIS — I10 Essential (primary) hypertension: Secondary | ICD-10-CM

## 2020-02-18 LAB — POCT GLYCOSYLATED HEMOGLOBIN (HGB A1C)
Est. average glucose Bld gHb Est-mCnc: 163
Hemoglobin A1C: 7.3 % — AB (ref 4.0–5.6)

## 2020-02-18 MED ORDER — POTASSIUM CHLORIDE CRYS ER 20 MEQ PO TBCR: 20.0000 meq | EXTENDED_RELEASE_TABLET | Freq: Every day | ORAL | 3 refills | Status: DC

## 2020-02-18 NOTE — Patient Instructions (Signed)
   Please bring your Covid vaccine card to your next appointment so we can update your medical record  . It is recommended to engage in 150 minutes of moderate exercise every week.

## 2020-02-19 LAB — HIV ANTIBODY (ROUTINE TESTING W REFLEX): HIV Screen 4th Generation wRfx: NONREACTIVE

## 2020-02-19 LAB — HEPATITIS C ANTIBODY: Hep C Virus Ab: <0.1 s/co ratio (ref 0.0–0.9)

## 2020-04-10 ENCOUNTER — Other Ambulatory Visit: Payer: Self-pay | Admitting: Family Medicine

## 2020-04-10 MED ORDER — PRAVASTATIN SODIUM 80 MG PO TABS: 80.0000 mg | ORAL_TABLET | Freq: Every day | ORAL | 1 refills | Status: DC

## 2020-04-10 NOTE — Telephone Encounter (Signed)
PT need a refill  pravastatin (PRAVACHOL) 80 MG tablet [[id number]] [id number], [place]  [address] [place] [address]  Phone: [phone] Fax: [phone]

## 2020-04-19 ENCOUNTER — Other Ambulatory Visit: Payer: Self-pay | Admitting: Family Medicine

## 2020-04-19 DIAGNOSIS — E119 Type 2 diabetes mellitus without complications: Secondary | ICD-10-CM

## 2020-04-19 NOTE — Telephone Encounter (Signed)
Requested Prescriptions  Pending Prescriptions Disp Refills  . JARDIANCE 25 MG TABS tablet [Pharmacy Med Name: JARDIANCE 25 MG TABLET] 90 tablet 1    Sig: TAKE 1 TABLET BY MOUTH DAILY.     Endocrinology:  Diabetes - SGLT2 Inhibitors Failed - 04/19/2020  1:16 AM      Failed - LDL in normal range and within 360 days    LDL Chol Calc (NIH)  Date Value Ref Range Status  10/18/2019 95 0 - 99 mg/dL Final         Passed - Cr in normal range and within 360 days    Creatinine, Ser  Date Value Ref Range Status  10/18/2019 1.24 0.76 - 1.27 mg/dL Final   Creatinine, POC  Date Value Ref Range Status  09/29/2018 n/a mg/dL Final         Passed - HBA1C is between 0 and 7.9 and within 180 days    Hemoglobin A1C  Date Value Ref Range Status  02/18/2020 7.3 (A) 4.0 - 5.6 % Final   Hgb A1c MFr Bld  Date Value Ref Range Status  10/10/2014 6.7 (A) 4.0 - 6.0 % Final         Passed - eGFR in normal range and within 360 days    GFR calc Af Amer  Date Value Ref Range Status  10/18/2019 80 >59 mL/min/1.73 Final   GFR calc non Af Amer  Date Value Ref Range Status  10/18/2019 69 >59 mL/min/1.73 Final         Passed - Valid encounter within last 6 months    Recent Outpatient Visits          2 months ago Type 2 diabetes mellitus with diabetic neuropathy, without long-term current use of insulin ([place])   [facility] Birdie Sons, MD   6 months ago Type 2 diabetes mellitus with diabetic neuropathy, without long-term current use of insulin ([place])   [facility] Birdie Sons, MD   10 months ago Type 2 diabetes mellitus without complication, without long-term current use of insulin ([place])   [facility] Birdie Sons, MD   1 year ago Type 2 diabetes mellitus without complication, without long-term current use of insulin [facility])   [facility] Birdie Sons, MD   1 year ago Type 2 diabetes mellitus without complication, without  long-term current use of insulin [facility])   [facility] Birdie Sons, MD      Future Appointments            In 2 months Fisher, Kirstie Peri, MD [facility], [place]

## 2020-04-28 LAB — HM DIABETES EYE EXAM

## 2020-05-06 ENCOUNTER — Other Ambulatory Visit: Payer: Self-pay | Admitting: Family Medicine

## 2020-05-06 NOTE — Telephone Encounter (Signed)
Requested Prescriptions  Pending Prescriptions Disp Refills   atenolol-chlorthalidone (TENORETIC) 50-25 MG tablet [Pharmacy Med Name: ATENOL/CHLORTHAL TAB 50/25] 90 tablet 3    Sig: TAKE 1 TABLET BY MOUTH  DAILY     Cardiovascular: Beta Blocker + Diuretic Combos Failed - 05/06/2020 10:44 PM      Failed - K in normal range and within 180 days    Potassium  Date Value Ref Range Status  10/18/2019 3.2 (L) 3.5 - 5.2 mmol/L Final         Failed - Na in normal range and within 180 days    Sodium  Date Value Ref Range Status  10/18/2019 143 134 - 144 mmol/L Final         Failed - Cr in normal range and within 180 days    Creatinine, Ser  Date Value Ref Range Status  10/18/2019 1.24 0.76 - 1.27 mg/dL Final   Creatinine, POC  Date Value Ref Range Status  09/29/2018 n/a mg/dL Final         Failed - Ca in normal range and within 180 days    Calcium  Date Value Ref Range Status  10/18/2019 9.4 8.7 - 10.2 mg/dL Final         Passed - Patient is not pregnant      Passed - Last BP in normal range    BP Readings from Last 1 Encounters:  02/18/20 (!) 130/84         Passed - Last Heart Rate in normal range    Pulse Readings from Last 1 Encounters:  02/18/20 56         Passed - Valid encounter within last 6 months    Recent Outpatient Visits          2 months ago Type 2 diabetes mellitus with diabetic neuropathy, without long-term current use of insulin ([place])   [facility] Birdie Sons, MD   6 months ago Type 2 diabetes mellitus with diabetic neuropathy, without long-term current use of insulin ([place])   [facility] Birdie Sons, MD   10 months ago Type 2 diabetes mellitus without complication, without long-term current use of insulin ([place])   [facility] Birdie Sons, MD   1 year ago Type 2 diabetes mellitus without complication, without long-term current use of insulin ([place])   [facility] Birdie Sons,  MD   1 year ago Type 2 diabetes mellitus without complication, without long-term current use of insulin ([place])   [place], Kirstie Peri, MD      Future Appointments            In 1 month Fisher, Kirstie Peri, MD [facility]            metFORMIN (GLUCOPHAGE-XR) 500 MG 24 hr tablet [Pharmacy Med Name: metFORMIN HCl ER 500 MG Oral Tablet Extended Release 24 Hour] 180 tablet 3    Sig: TAKE 2 TABLETS BY MOUTH  DAILY WITH BREAKFAST     Endocrinology:  Diabetes - Biguanides Passed - 05/06/2020 10:44 PM      Passed - Cr in normal range and within 360 days    Creatinine, Ser  Date Value Ref Range Status  10/18/2019 1.24 0.76 - 1.27 mg/dL Final   Creatinine, POC  Date Value Ref Range Status  09/29/2018 n/a mg/dL Final         Passed - HBA1C is between 0 and 7.9 and within 180  days    Hemoglobin A1C  Date Value Ref Range Status  02/18/2020 7.3 (A) 4.0 - 5.6 % Final   Hgb A1c MFr Bld  Date Value Ref Range Status  10/10/2014 6.7 (A) 4.0 - 6.0 % Final         Passed - eGFR in normal range and within 360 days    GFR calc Af Amer  Date Value Ref Range Status  10/18/2019 80 >59 mL/min/1.73 Final   GFR calc non Af Amer  Date Value Ref Range Status  10/18/2019 69 >59 mL/min/1.73 Final         Passed - Valid encounter within last 6 months    Recent Outpatient Visits          2 months ago Type 2 diabetes mellitus with diabetic neuropathy, without long-term current use of insulin ([place])   [facility] Birdie Sons, MD   6 months ago Type 2 diabetes mellitus with diabetic neuropathy, without long-term current use of insulin ([place])   [facility] Birdie Sons, MD   10 months ago Type 2 diabetes mellitus without complication, without long-term current use of insulin ([place])   [facility] Birdie Sons, MD   1 year ago Type 2 diabetes mellitus without complication, without long-term current use of  insulin [facility])   [facility] Birdie Sons, MD   1 year ago Type 2 diabetes mellitus without complication, without long-term current use of insulin [facility])   [facility] Birdie Sons, MD      Future Appointments            In 1 month Fisher, Kirstie Peri, MD [facility], [place]

## 2020-05-06 NOTE — Telephone Encounter (Signed)
Requested medications are due for refill today?    Yes   Requested medications are on active medication list?  Yes  Last Refill:   02/10/2020  # 90 with no refills   Future visit scheduled?  Yes in one month   Notes to Clinic:  Medication failed RX refill protocol due to no labs with the past 180 days.

## 2020-06-26 ENCOUNTER — Encounter: Payer: Self-pay | Admitting: Family Medicine

## 2020-06-26 ENCOUNTER — Other Ambulatory Visit: Payer: Self-pay

## 2020-06-26 ENCOUNTER — Ambulatory Visit: Payer: 59 | Admitting: Family Medicine

## 2020-06-26 VITALS — BP 128/86 | HR 61 | Temp 98.9°F | Resp 16 | Wt 237.0 lb

## 2020-06-26 DIAGNOSIS — Z23 Encounter for immunization: Secondary | ICD-10-CM

## 2020-06-26 DIAGNOSIS — M545 Low back pain, unspecified: Secondary | ICD-10-CM

## 2020-06-26 DIAGNOSIS — E1140 Type 2 diabetes mellitus with diabetic neuropathy, unspecified: Secondary | ICD-10-CM

## 2020-06-26 DIAGNOSIS — I10 Essential (primary) hypertension: Secondary | ICD-10-CM | POA: Diagnosis not present

## 2020-06-26 LAB — POCT GLYCOSYLATED HEMOGLOBIN (HGB A1C)
Est. average glucose Bld gHb Est-mCnc: 189
Hemoglobin A1C: 8.2 % — AB (ref 4.0–5.6)

## 2020-06-26 MED ORDER — CYCLOBENZAPRINE HCL 5 MG PO TABS: 5.0000 mg | ORAL_TABLET | Freq: Every evening | ORAL | 0 refills | Status: AC

## 2020-06-26 MED ORDER — NAPROXEN 500 MG PO TABS: 500.0000 mg | ORAL_TABLET | Freq: Two times a day (BID) | ORAL | 0 refills | Status: AC

## 2020-06-26 NOTE — Progress Notes (Signed)
Established patient visit   Patient: Douglas Murray   DOB: August 21, 1970   49 y.o. Male  MRN: [id number] Visit Date: 06/26/2020  Today's healthcare provider: Lelon Huh, MD   Chief Complaint  Patient presents with  . Diabetes  . Hypertension   Subjective    HPI  Diabetes Mellitus Type II, Follow-up  Lab Results  Component Value Date   HGBA1C 8.2 (A) 06/26/2020   HGBA1C 7.3 (A) 02/18/2020   HGBA1C 7.6 (A) 10/18/2019   Wt Readings from Last 3 Encounters:  06/26/20 237 lb (107.5 kg)  02/18/20 (!) 235 lb 12.8 oz (107 kg)  10/18/19 235 lb (106.6 kg)   Last seen for diabetes 4 months ago.  Management since then includes conseling patient to continue work on increasing physical activity. Continue same medications. He reports good compliance with treatment. He is not having side effects.  Symptoms: Yes fatigue No foot ulcerations  No appetite changes No nausea  No paresthesia of the feet  Yes polydipsia  No polyuria No visual disturbances   No vomiting     Home blood sugar records: fasting range: 190's  Episodes of hypoglycemia? No    Current insulin regiment: none Most Recent Eye Exam: not UTD Current exercise: walking Current diet habits: in general, an "unhealthy" diet  Pertinent Labs: Lab Results  Component Value Date   CHOL 142 10/18/2019   HDL 30 (L) 10/18/2019   LDLCALC 95 10/18/2019   TRIG 89 10/18/2019   CHOLHDL 4.7 10/18/2019   Lab Results  Component Value Date   NA 143 10/18/2019   K 3.2 (L) 10/18/2019   CREATININE 1.24 10/18/2019   GFRNONAA 69 10/18/2019   GFRAA 80 10/18/2019   GLUCOSE 129 (H) 10/18/2019     ---------------------------------------------------------------------------------------------------  Hypertension, follow-up  BP Readings from Last 3 Encounters:  06/26/20 128/86  02/18/20 (!) 130/84  10/18/19 122/80   Wt Readings from Last 3 Encounters:  06/26/20 237 lb (107.5 kg)  02/18/20 (!) 235 lb 12.8 oz (107  kg)  10/18/19 235 lb (106.6 kg)     He was last seen for hypertension 4 months ago.  BP at that visit was 130/84. Management since that visit includes continueing same medication.  He reports good compliance with treatment. He is not having side effects.  He is following a Regular diet. He is exercising. He does not smoke.  Use of agents associated with hypertension: none.   Outside blood pressures are not checked. Symptoms: No chest pain No chest pressure  No palpitations No syncope  No dyspnea No orthopnea  No paroxysmal nocturnal dyspnea No lower extremity edema   Pertinent labs: Lab Results  Component Value Date   CHOL 142 10/18/2019   HDL 30 (L) 10/18/2019   LDLCALC 95 10/18/2019   TRIG 89 10/18/2019   CHOLHDL 4.7 10/18/2019   Lab Results  Component Value Date   NA 143 10/18/2019   K 3.2 (L) 10/18/2019   CREATININE 1.24 10/18/2019   GFRNONAA 69 10/18/2019   GFRAA 80 10/18/2019   GLUCOSE 129 (H) 10/18/2019     The 10-year ASCVD risk score Mikey Bussing DC Jr., et al., 2013) is: 14.6%   ---------------------------------------------------------------------------------------------------  Follow up for Hypokalemia:  The patient was last seen for this 4 months ago. Changes made at last visit include starting potassium Chloride 23meq daily.  He reports good compliance with treatment. He feels that condition is Unchanged. He is not having side effects.   -----------------------------------------------------------------------------------------  Back pain:  Patient complains of lower back pain for the past week after pulling muscle in back. He describes the pain as a pulling sensation. Pain radiates down both legs. Pain is aggravated with bending over. Not taken any OTC medications.      Medications: Outpatient Medications Prior to Visit  Medication Sig  . atenolol-chlorthalidone (TENORETIC) 50-25 MG tablet TAKE 1 TABLET BY MOUTH  DAILY  . JARDIANCE 25 MG TABS  tablet TAKE 1 TABLET BY MOUTH DAILY.  . metFORMIN (GLUCOPHAGE-XR) 500 MG 24 hr tablet TAKE 2 TABLETS BY MOUTH  DAILY WITH BREAKFAST  . potassium chloride SA (KLOR-CON M20) 20 MEQ tablet Take 1 tablet (20 mEq total) by mouth daily.  . pravastatin (PRAVACHOL) 80 MG tablet Take 1 tablet (80 mg total) by mouth daily.   No facility-administered medications prior to visit.    Review of Systems  Constitutional: Positive for fatigue. Negative for appetite change, chills and fever.  Respiratory: Positive for wheezing (when laying down). Negative for chest tightness and shortness of breath.   Cardiovascular: Negative for chest pain and palpitations.  Gastrointestinal: Negative for abdominal pain, nausea and vomiting.  Endocrine: Positive for polydipsia.  Musculoskeletal: Positive for back pain.     Objective    BP 128/86 (BP Location: Left Arm, Patient Position: Sitting, Cuff Size: Large)   Pulse 61   Temp 98.9 F (37.2 C) (Oral)   Resp 16   Wt 237 lb (107.5 kg)   BMI 33.05 kg/m   Physical Exam    General: Appearance:    Obese male in no acute distress  Eyes:    PERRL, conjunctiva/corneas clear, EOM's intact       Lungs:     Clear to auscultation bilaterally, respirations unlabored  Heart:    Normal heart rate. Normal rhythm. No murmurs, rubs, or gallops.   MS:   All extremities are intact. Tender ar right mid para lumbar musculature. No gross deformities.   Neurologic:   Awake, alert, oriented x 3. No apparent focal neurological           defect.         Results for orders placed or performed in visit on 06/26/20  POCT HgB A1C  Result Value Ref Range   Hemoglobin A1C 8.2 (A) 4.0 - 5.6 %   Est. average glucose Bld gHb Est-mCnc 189     Assessment & Plan     1. Type 2 diabetes mellitus with diabetic neuropathy, without long-term current use of insulin (HCC) A1c is up significantly today. Counseled on improving diet and getting regular exercise which has been limited due to back  injury. Discussed increasing metformin to 4 tablets daily. He prefers to work on improving diet, exercising and weight loss first. Will recheck A1c in about 3 months.   2. Essential (primary) hypertension Well controlled.  Continue current medications.    3. Acute bilateral low back pain without sciatica  - naproxen (NAPROSYN) 500 MG tablet; Take 1 tablet (500 mg total) by mouth 2 (two) times daily with a meal for 10 days. Take with food  Dispense: 20 tablet; Refill: 0 - cyclobenzaprine (FLEXERIL) 5 MG tablet; Take 1-2 tablets (5-10 mg total) by mouth every evening for 10 days.  Dispense: 10 tablet; Refill: 0 Call if symptoms change or if not rapidly improving.   4. Need for influenza vaccination  - Flu Vaccine QUAD 36+ mos IM (Fluarix/Fluzone)         The entirety of the information documented in the  History of Present Illness, Review of Systems and Physical Exam were personally obtained by me. Portions of this information were initially documented by the CMA and reviewed by me for thoroughness and accuracy.      Lelon Huh, MD  [facility] [phone] (phone) [phone] (fax)  [place]

## 2020-06-28 NOTE — Patient Instructions (Signed)
.   Please review the attached list of medications and notify my office if there are any errors.   . Please bring all of your medications to every appointment so we can make sure that our medication list is the same as yours.   

## 2020-07-10 ENCOUNTER — Other Ambulatory Visit: Payer: Self-pay | Admitting: Family Medicine

## 2020-07-17 ENCOUNTER — Ambulatory Visit: Payer: Self-pay | Admitting: *Deleted

## 2020-07-17 NOTE — Telephone Encounter (Signed)
Please advise 

## 2020-07-17 NOTE — Telephone Encounter (Signed)
Proceed with appointment tomorrow as scheduled. If symptoms worsening or fever develops, may try to work in after [employer]

## 2020-07-17 NOTE — Telephone Encounter (Signed)
Constant HA for 2 days. Feels the pressure behind the eyes and at the back of his jar. Cough and other cold symptoms previously. Tried Tylenol Sinus caps that helped slightly. Advised ibuprofen and nasal saline spray. No history of migraines. No fever recently. Failed DT for in office visit, virtual visit scheduled with Maurine Minister Chrismon for 12/28 @1 :20p. Use patient cell phone listed in appointment note.   Reason for Disposition  [1] MODERATE headache (e.g., interferes with normal activities) AND [2] present > 24 hours AND [3] unexplained  (Exceptions: analgesics not tried, typical migraine, or headache part of viral illness)  Answer Assessment - Initial Assessment Questions 1. LOCATION: "Where does it hurt?"      headache 2. ONSET: "When did the headache start?" (Minutes, hours or days)      Christmas Eve 3. PATTERN: "Does the pain come and go, or has it been constant since it started?"     Constant but it does eases off some 4. SEVERITY: "How bad is the pain?" and "What does it keep you from doing?"  (e.g., Scale 1-10; mild, moderate, or severe)   - MILD (1-3): doesn't interfere with normal activities    - MODERATE (4-7): interferes with normal activities or awakens from sleep    - SEVERE (8-10): excruciating pain, unable to do any normal activities        severe 5. RECURRENT SYMPTOM: "Have you ever had headaches before?" If Yes, ask: "When was the last time?" and "What happened that time?"      no 6. CAUSE: "What do you think is causing the headache?"     No but had sinus-allergy symptoms that presented same day headache started. 7. MIGRAINE: "Have you been diagnosed with migraine headaches?" If Yes, ask: "Is this headache similar?"      no 8. HEAD INJURY: "Has there been any recent injury to the head?"       no 9. OTHER SYMPTOMS: "Do you have any other symptoms?" (fever, stiff neck, eye pain, sore throat, cold symptoms)     Eye soreness, cold symptoms with congestion and running down  throat  10. PREGNANCY: "Is there any chance you are pregnant?" "When was your last menstrual period?"       na  Protocols used: HEADACHE-A-AH

## 2020-07-17 NOTE — Telephone Encounter (Signed)
Advised the patient of provider's note below.

## 2020-07-18 ENCOUNTER — Other Ambulatory Visit: Payer: Self-pay

## 2020-07-18 ENCOUNTER — Encounter: Payer: Self-pay | Admitting: Family Medicine

## 2020-07-18 ENCOUNTER — Ambulatory Visit (INDEPENDENT_AMBULATORY_CARE_PROVIDER_SITE_OTHER): Payer: 59 | Admitting: Family Medicine

## 2020-07-18 DIAGNOSIS — Z20822 Contact with and (suspected) exposure to covid-19: Secondary | ICD-10-CM | POA: Diagnosis not present

## 2020-07-18 DIAGNOSIS — R519 Headache, unspecified: Secondary | ICD-10-CM | POA: Diagnosis not present

## 2020-07-18 MED ORDER — AMOXICILLIN 875 MG PO TABS: 875.0000 mg | ORAL_TABLET | Freq: Two times a day (BID) | ORAL | 0 refills | Status: DC

## 2020-07-18 NOTE — Progress Notes (Signed)
Telephone Visit    Virtual Visit via Video Note   This visit type was conducted due to national recommendations for restrictions regarding the COVID-19 Pandemic (e.g. social distancing) in an effort to limit this patient's exposure and mitigate transmission in our community. This patient is at least at moderate risk for complications without adequate follow up. This format is felt to be most appropriate for this patient at this time. Physical exam was limited by quality of the video and audio technology used for the visit.   Patient location: Home Provider location: Office  I discussed the limitations of evaluation and management by telemedicine and the availability of in person appointments. The patient expressed understanding and agreed to proceed.  Patient: Douglas Murray   DOB: 10-Jul-1971   49 y.o. Male  MRN: [id number] Visit Date: 07/18/2020  Today's healthcare provider: Dortha Kern, PA-C   No chief complaint on file.  Subjective    HPI  Upper respiratory symptoms He complains of bilateral ear pressure/pain, cough described as productive and headache described as throbbing.with chills. Onset of symptoms was a few days ago and gradually improving.He is drinking plenty of fluids.  Past history is significant for no history of pneumonia or bronchitis. 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 Asymptomatic.   Colon polyp 2010   Family History  Problem Relation Age of Onset   Diabetes Mother    Cancer Maternal Aunt    No Known Allergies    Medications: Outpatient Medications Prior to Visit  Medication Sig   atenolol-chlorthalidone (TENORETIC) 50-25 MG tablet TAKE 1 TABLET BY MOUTH  DAILY   JARDIANCE 25 MG TABS tablet TAKE 1 TABLET BY MOUTH DAILY.   metFORMIN (GLUCOPHAGE-XR) 500 MG 24 hr tablet TAKE 2 TABLETS BY MOUTH  DAILY WITH BREAKFAST   potassium chloride SA (KLOR-CON M20) 20 MEQ tablet Take 1 tablet (20 mEq total) by mouth daily.   pravastatin (PRAVACHOL) 80 MG tablet Take 1 tablet (80 mg total) by mouth daily.   No facility-administered medications prior to visit.    Review of Systems  Constitutional: Negative.   HENT: Positive for sinus pressure.   Respiratory: Negative.   Cardiovascular: Negative.   Gastrointestinal: Negative.   Neurological: Positive for headaches.      Objective    There were no vitals taken for this visit.    During telephonic interview, slight coughing without wheeze or dyspnea.   Assessment & Plan     1. Headache around the eyes Having some throbbing headaches with teeth aching and pressure around frontal sinuses. No fever or loss of taste. Headaches occur at night sometimes. May need prednisone taper and add antibiotic. Recheck prn. - amoxicillin (AMOXIL) 875 MG tablet; Take 1 tablet (875 mg total) by mouth 2 (two) times daily.  Dispense: 20 tablet; Refill: 0  2. Suspected COVID-19 virus infection Had a COVID test done yesterday and awaiting results. Continue  Mucinex- DM and increase fluid intake. Recheck prn.   No follow-ups on file.     I discussed the assessment and treatment plan with the patient. The patient was provided an opportunity to ask questions and all were answered. The patient agreed with the plan and  demonstrated an understanding of the instructions.   The patient was advised to call back or seek an in-person evaluation if the symptoms worsen or if the condition fails to improve as anticipated.  I provided 12 minutes of non-face-to-face time during this encounter.  I, Arshdeep Bolger, PA-C, have reviewed all documentation for this visit. The documentation on 07/18/20 for the exam, diagnosis, procedures, and orders are all accurate and complete.   Dortha Kern, PA-C [employer] [phone] (phone) [phone] (fax)  [facility]  .

## 2020-10-30 ENCOUNTER — Ambulatory Visit: Payer: Self-pay | Admitting: Family Medicine

## 2020-10-31 ENCOUNTER — Other Ambulatory Visit: Payer: Self-pay | Admitting: Family Medicine

## 2020-10-31 DIAGNOSIS — E119 Type 2 diabetes mellitus without complications: Secondary | ICD-10-CM

## 2020-11-06 ENCOUNTER — Ambulatory Visit: Payer: Self-pay | Admitting: Family Medicine

## 2020-11-08 ENCOUNTER — Other Ambulatory Visit: Payer: Self-pay | Admitting: Family Medicine

## 2020-11-08 NOTE — Telephone Encounter (Signed)
Requested Prescriptions  Pending Prescriptions Disp Refills  . pravastatin (PRAVACHOL) 80 MG tablet [Pharmacy Med Name: Pravastatin Sodium 80 MG Oral Tablet] 90 tablet 2    Sig: TAKE 1 TABLET BY MOUTH  DAILY     Cardiovascular:  Antilipid - Statins Failed - 11/08/2020 11:10 PM      Failed - Total Cholesterol in normal range and within 360 days    Cholesterol, Total  Date Value Ref Range Status  10/18/2019 142 100 - 199 mg/dL Final         Failed - LDL in normal range and within 360 days    LDL Chol Calc (NIH)  Date Value Ref Range Status  10/18/2019 95 0 - 99 mg/dL Final         Failed - HDL in normal range and within 360 days    HDL  Date Value Ref Range Status  10/18/2019 30 (L) >39 mg/dL Final         Failed - Triglycerides in normal range and within 360 days    Triglycerides  Date Value Ref Range Status  10/18/2019 89 0 - 149 mg/dL Final         Passed - Patient is not pregnant      Passed - Valid encounter within last 12 months    Recent Outpatient Visits          3 months ago Headache around the eyes   Farmer, Vickki Muff, PA-C   4 months ago Type 2 diabetes mellitus with diabetic neuropathy, without long-term current use of insulin ([place])   [facility] Birdie Sons, MD   8 months ago Type 2 diabetes mellitus with diabetic neuropathy, without long-term current use of insulin ([place])   [facility] Birdie Sons, MD   1 year ago Type 2 diabetes mellitus with diabetic neuropathy, without long-term current use of insulin ([place])   [facility] Birdie Sons, MD   1 year ago Type 2 diabetes mellitus without complication, without long-term current use of insulin [facility])   [place], Kirstie Peri, MD      Future Appointments            In 1 week Fisher, Kirstie Peri, MD [facility]

## 2020-11-20 ENCOUNTER — Ambulatory Visit: Payer: Self-pay | Admitting: Family Medicine

## 2020-12-08 ENCOUNTER — Other Ambulatory Visit: Payer: Self-pay

## 2020-12-08 ENCOUNTER — Ambulatory Visit: Payer: 59 | Admitting: Family Medicine

## 2020-12-08 ENCOUNTER — Encounter: Payer: Self-pay | Admitting: Family Medicine

## 2020-12-08 VITALS — BP 124/81 | HR 63 | Temp 98.6°F | Resp 16 | Ht 71.0 in | Wt 230.0 lb

## 2020-12-08 DIAGNOSIS — I10 Essential (primary) hypertension: Secondary | ICD-10-CM

## 2020-12-08 DIAGNOSIS — E1140 Type 2 diabetes mellitus with diabetic neuropathy, unspecified: Secondary | ICD-10-CM

## 2020-12-08 DIAGNOSIS — E881 Lipodystrophy, not elsewhere classified: Secondary | ICD-10-CM | POA: Diagnosis not present

## 2020-12-08 LAB — POCT GLYCOSYLATED HEMOGLOBIN (HGB A1C)
Est. average glucose Bld gHb Est-mCnc: 177
Hemoglobin A1C: 7.8 % — AB (ref 4.0–5.6)

## 2020-12-08 LAB — POCT UA - MICROALBUMIN: Microalbumin Ur, POC: 20 mg/L

## 2020-12-08 NOTE — Progress Notes (Signed)
Established patient visit   Patient: Douglas Murray   DOB: [birth year]   50 y.o. Male  MRN: [id number] Visit Date: 12/08/2020  Today's healthcare provider: Lelon Huh, MD   Chief Complaint  Patient presents with  . Diabetes  . Hypertension   Subjective    HPI  Diabetes Mellitus Type II, Follow-up  Lab Results  Component Value Date   HGBA1C 8.2 (A) 06/26/2020   HGBA1C 7.3 (A) 02/18/2020   HGBA1C 7.6 (A) 10/18/2019   Wt Readings from Last 3 Encounters:  12/08/20 230 lb (104.3 kg)  06/26/20 237 lb (107.5 kg)  02/18/20 (!) 235 lb 12.8 oz (107 kg)   Last seen for diabetes 5 months ago.  Management since then includes counseling on improving diet and getting regular exercise which had been limited due to back injury. Discussed increasing metformin to 4 tablets daily. He preferred to work on improving diet, exercising and weight loss first.  He reports good compliance with treatment. He is not having side effects.  Symptoms: No fatigue No foot ulcerations  No appetite changes No nausea  No paresthesia of the feet  No polydipsia  No polyuria No visual disturbances   No vomiting     Home blood sugar records: fasting range: 190s  Episodes of hypoglycemia? No    Current insulin regiment: none Most Recent Eye Exam: not UTD Current exercise: walking Current diet habits: well balanced  Pertinent Labs: Lab Results  Component Value Date   CHOL 142 10/18/2019   HDL 30 (L) 10/18/2019   LDLCALC 95 10/18/2019   TRIG 89 10/18/2019   CHOLHDL 4.7 10/18/2019   Lab Results  Component Value Date   NA 143 10/18/2019   K 3.2 (L) 10/18/2019   CREATININE 1.24 10/18/2019   GFRNONAA 69 10/18/2019   GFRAA 80 10/18/2019   GLUCOSE 129 (H) 10/18/2019     ---------------------------------------------------------------------------------------------------  Hypertension, follow-up  BP Readings from Last 3 Encounters:  12/08/20 124/81  06/26/20 128/86  02/18/20  (!) 130/84   Wt Readings from Last 3 Encounters:  12/08/20 230 lb (104.3 kg)  06/26/20 237 lb (107.5 kg)  02/18/20 (!) 235 lb 12.8 oz (107 kg)     He was last seen for hypertension 5 months ago.  BP at that visit was 128/86. Management since that visit includes continue same medication.  He reports good compliance with treatment. He is not having side effects.  He is following a Low fat, Low Sodium diet. He is exercising. He does not smoke.  Use of agents associated with hypertension: none.   Outside blood pressures are not being checked. Symptoms: No chest pain No chest pressure  No palpitations No syncope  No dyspnea No orthopnea  No paroxysmal nocturnal dyspnea No lower extremity edema   Pertinent labs: Lab Results  Component Value Date   CHOL 142 10/18/2019   HDL 30 (L) 10/18/2019   LDLCALC 95 10/18/2019   TRIG 89 10/18/2019   CHOLHDL 4.7 10/18/2019   Lab Results  Component Value Date   NA 143 10/18/2019   K 3.2 (L) 10/18/2019   CREATININE 1.24 10/18/2019   GFRNONAA 69 10/18/2019   GFRAA 80 10/18/2019   GLUCOSE 129 (H) 10/18/2019     The 10-year ASCVD risk score Mikey Bussing DC Jr., et al., 2013) is: 14.5%       Medications: Outpatient Medications Prior to Visit  Medication Sig  . amoxicillin (AMOXIL) 875 MG tablet Take 1 tablet (875 mg total) by  mouth 2 (two) times daily.  Marland Kitchen atenolol-chlorthalidone (TENORETIC) 50-25 MG tablet TAKE 1 TABLET BY MOUTH  DAILY  . JARDIANCE 25 MG TABS tablet TAKE 1 TABLET BY MOUTH DAILY.  . metFORMIN (GLUCOPHAGE-XR) 500 MG 24 hr tablet TAKE 2 TABLETS BY MOUTH  DAILY WITH BREAKFAST  . potassium chloride SA (KLOR-CON M20) 20 MEQ tablet Take 1 tablet (20 mEq total) by mouth daily.  . pravastatin (PRAVACHOL) 80 MG tablet TAKE 1 TABLET BY MOUTH  DAILY   No facility-administered medications prior to visit.    Review of Systems  Constitutional: Negative for activity change.  Respiratory: Negative for cough and shortness of breath.    Cardiovascular: Negative for chest pain, palpitations and leg swelling.  Endocrine: Negative for cold intolerance, heat intolerance, polydipsia, polyphagia and polyuria.  Neurological: Negative for dizziness, light-headedness and headaches.       Objective    BP 124/81   Pulse 63   Temp 98.6 F (37 C)   Resp 16   Ht 5\' 11"  (1.803 m)   Wt 230 lb (104.3 kg)   BMI 32.08 kg/m     Physical Exam    General: Appearance:    Obese male in no acute distress  Eyes:    PERRL, conjunctiva/corneas clear, EOM's intact       Lungs:     Clear to auscultation bilaterally, respirations unlabored  Heart:    Normal heart rate. Normal rhythm. No murmurs, rubs, or gallops.   MS:   All extremities are intact.   Neurologic:   Awake, alert, oriented x 3. No apparent focal neurological           defect.         Results for orders placed or performed in visit on 12/08/20  POCT glycosylated hemoglobin (Hb A1C)  Result Value Ref Range   Hemoglobin A1C 7.8 (A) 4.0 - 5.6 %   Est. average glucose Bld gHb Est-mCnc 177   POCT UA - Microalbumin  Result Value Ref Range   Microalbumin Ur, POC 20 mg/L   Creatinine, POC     Albumin/Creatinine Ratio, Urine, POC      Assessment & Plan     1. Type 2 diabetes mellitus with diabetic neuropathy, without long-term current use of insulin ([place]) Improving, he feels he can still make improvements to diet and increase exercise to lose weight. Continue current medications.    2. Essential (primary) hypertension Well controlled.  Continue current medications.   - Magnesium - TSH  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   - CBC - Comprehensive metabolic panel - Lipid panel   Future Appointments  Date Time Provider [place]  05/14/2021  3:20 PM Caryn Section Kirstie Peri, MD [facility]         The entirety of the information documented in the History of Present Illness, Review of Systems and Physical Exam were  personally obtained by me. Portions of this information were initially documented by the CMA and reviewed by me for thoroughness and accuracy.      Lelon Huh, MD  [facility] [phone] (phone) [phone] (fax)  [place]

## 2020-12-08 NOTE — Patient Instructions (Addendum)
.   Please go to the lab draw station in [address] on the second floor of [facility] . Normal hours are 8:00am to 11:30am and 1:00pm to 4:00pm Monday through Friday  . It is recommended to engage in 150 minutes of moderate exercise every week.   . Please contact your eyecare professional to schedule a routine eye exam  . I strongly recommend at least one booster dose (a third shot) of the Covid vaccine for all adults. People at high risk for serious Covid infections should have a second booster dose 4-6 months after the first booster.  People at average risk for serious Covid infections should have a second booster dose 6-12 months after the first booster.

## 2020-12-09 LAB — CBC
Hematocrit: 47.8 % (ref 37.5–51.0)
Hemoglobin: 16 g/dL (ref 13.0–17.7)
MCH: 28.9 pg (ref 26.6–33.0)
MCHC: 33.5 g/dL (ref 31.5–35.7)
MCV: 86 fL (ref 79–97)
Platelets: 232 10*3/uL (ref 150–450)
RBC: 5.53 x10E6/uL (ref 4.14–5.80)
RDW: 13.3 % (ref 11.6–15.4)
WBC: 8.2 10*3/uL (ref 3.4–10.8)

## 2020-12-09 LAB — COMPREHENSIVE METABOLIC PANEL
ALT: 33 IU/L (ref 0–44)
AST: 19 IU/L (ref 0–40)
Albumin/Globulin Ratio: 1.7 (ref 1.2–2.2)
Albumin: 4.7 g/dL (ref 4.0–5.0)
Alkaline Phosphatase: 80 IU/L (ref 44–121)
BUN/Creatinine Ratio: 11 (ref 9–20)
BUN: 13 mg/dL (ref 6–24)
Bilirubin Total: 0.3 mg/dL (ref 0.0–1.2)
CO2: 25 mmol/L (ref 20–29)
Calcium: 10.1 mg/dL (ref 8.7–10.2)
Chloride: 99 mmol/L (ref 96–106)
Creatinine, Ser: 1.19 mg/dL (ref 0.76–1.27)
Globulin, Total: 2.7 g/dL (ref 1.5–4.5)
Glucose: 108 mg/dL — ABNORMAL HIGH (ref 65–99)
Potassium: 3.4 mmol/L — ABNORMAL LOW (ref 3.5–5.2)
Sodium: 140 mmol/L (ref 134–144)
Total Protein: 7.4 g/dL (ref 6.0–8.5)
eGFR: 75 mL/min/{1.73_m2} (ref >59)

## 2020-12-09 LAB — LIPID PANEL
Chol/HDL Ratio: 6 ratio — ABNORMAL HIGH (ref 0.0–5.0)
Cholesterol, Total: 155 mg/dL (ref 100–199)
HDL: 26 mg/dL — ABNORMAL LOW (ref >39)
LDL Chol Calc (NIH): 91 mg/dL (ref 0–99)
Triglycerides: 222 mg/dL — ABNORMAL HIGH (ref 0–149)
VLDL Cholesterol Cal: 38 mg/dL (ref 5–40)

## 2020-12-09 LAB — MAGNESIUM: Magnesium: 2 mg/dL (ref 1.6–2.3)

## 2020-12-09 LAB — TSH: TSH: 1.58 u[IU]/mL (ref 0.450–4.500)

## 2021-01-22 ENCOUNTER — Encounter: Payer: Self-pay | Admitting: Emergency Medicine

## 2021-01-22 ENCOUNTER — Ambulatory Visit
Admission: EM | Admit: 2021-01-22 | Discharge: 2021-01-22 | Disposition: A | Discharge status: Home / Self Care | Payer: 59 | Attending: Emergency Medicine | Admitting: Emergency Medicine

## 2021-01-22 ENCOUNTER — Other Ambulatory Visit: Payer: Self-pay

## 2021-01-22 DIAGNOSIS — R1032 Left lower quadrant pain: Secondary | ICD-10-CM | POA: Diagnosis not present

## 2021-01-22 DIAGNOSIS — R1012 Left upper quadrant pain: Secondary | ICD-10-CM | POA: Diagnosis not present

## 2021-01-22 LAB — URINALYSIS, COMPLETE (UACMP) WITH MICROSCOPIC
Bilirubin Urine: NEGATIVE
Glucose, UA: >1000 mg/dL — AB
Hgb urine dipstick: NEGATIVE
Ketones, ur: NEGATIVE mg/dL
Leukocytes,Ua: NEGATIVE
Nitrite: NEGATIVE
Protein, ur: NEGATIVE mg/dL
Specific Gravity, Urine: <1.005 — ABNORMAL LOW (ref 1.005–1.030)
pH: 5.5 (ref 5.0–8.0)

## 2021-01-22 LAB — GLUCOSE, CAPILLARY: Glucose-Capillary: 144 mg/dL — ABNORMAL HIGH (ref 70–99)

## 2021-01-22 MED ORDER — DICYCLOMINE HCL 20 MG PO TABS: 20.0000 mg | ORAL_TABLET | Freq: Three times a day (TID) | ORAL | 0 refills | Status: DC

## 2021-01-22 MED ORDER — PANTOPRAZOLE SODIUM 20 MG PO TBEC: 20.0000 mg | DELAYED_RELEASE_TABLET | Freq: Every day | ORAL | 0 refills | Status: DC

## 2021-01-22 NOTE — ED Provider Notes (Signed)
[facility]    CSN: [id number] Arrival date & time: 01/22/21  0856      History   Chief Complaint Chief Complaint  Patient presents with   Abdominal Pain    HPI Aaryan Essman is a 50 y.o. male presenting for approximately 3 to 4-day history of left upper quadrant and left lower quadrant abdominal pain.  Patient says that he has a cramping sensation that comes and goes about 10 times a day.  He says the pain was initially about 6 out of 10 but is now 4 out of 10.  Patient says he had a bowel movement yesterday which was normal appearing.  Denies hard stools, loose stools or dark/tarry stools.  Also did not see any blood in the stool.  Denies any fever, change in appetite, nausea/vomiting.  Denies similar symptoms in the past.  He has not taken any over-the-counter medication for symptoms.  Past medical history is significant for type 2 diabetes the patient says his managed well on medication.  Also has history of hypertension and hyperlipidemia.  Additionally, patient denies any chest pain or breathing difficulty.  Has not been around any other people with similar symptoms.  No other complaints or concerns.  HPI  Past Medical History:  Diagnosis Date   Allergy    Cholelithiasis 2001   Identified on ultrasound. Asymptomatic.   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 Authorizing Provider  dicyclomine (BENTYL) 20 MG tablet Take 1 tablet (20 mg total) by mouth 4 (four) times daily -  before meals and at bedtime for 5 days. 01/22/21 01/27/21 Yes Danton Clap, PA-C  pantoprazole (PROTONIX) 20 MG tablet Take 1 tablet (20 mg total) by mouth daily for 14 days. 01/22/21 02/05/21 Yes Danton Clap, PA-C  atenolol-chlorthalidone (TENORETIC) 50-25 MG tablet TAKE 1 TABLET BY MOUTH  DAILY 05/07/20   Birdie Sons, MD  JARDIANCE 25 MG TABS tablet TAKE 1 TABLET BY MOUTH DAILY. 10/31/20   Birdie Sons, MD  metFORMIN (GLUCOPHAGE-XR) 500 MG 24 hr tablet TAKE 2 TABLETS BY MOUTH  DAILY WITH BREAKFAST 05/06/20   Birdie Sons, MD  potassium chloride SA (KLOR-CON M20) 20 MEQ tablet Take 1 tablet (20 mEq total) by mouth daily. 02/18/20   Birdie Sons, MD  pravastatin (PRAVACHOL) 80 MG tablet TAKE 1 TABLET BY MOUTH  DAILY 11/08/20   Birdie Sons, MD    Family History Family History  Problem Relation Age of Onset   Diabetes Mother    Cancer Maternal Aunt     Social History Social History   Tobacco Use   Smoking status: Never   Smokeless tobacco: Never  Substance Use Topics   Alcohol use: No   Drug use: No     Allergies   Patient has no known allergies.  Review of Systems Review of Systems  Constitutional:  Negative for appetite change, fatigue and fever.  HENT:  Negative for congestion.   Respiratory:  Negative for cough and shortness of breath.   Cardiovascular:  Negative for chest pain.  Gastrointestinal:  Positive for abdominal pain. Negative for abdominal distention, anal bleeding, blood in stool, constipation, diarrhea, nausea, rectal pain and vomiting.  Genitourinary:  Negative for difficulty urinating, dysuria, frequency, hematuria and testicular pain.  Musculoskeletal:  Negative for back pain.    Physical Exam Triage Vital Signs ED Triage Vitals  Enc Vitals Group     BP 01/22/21 0915 (!) 126/97     Pulse Rate 01/22/21 0915 68      Resp 01/22/21 0915 20     Temp 01/22/21 0915 99 F (37.2 C)     Temp Source 01/22/21 0915 Oral     SpO2 01/22/21 0915 98 %     Weight --      Height --      Head Circumference --      Peak Flow --      Pain Score 01/22/21 0912 4     Pain Loc --      Pain Edu? --      Excl. in [place]? --    No data found.  Updated Vital Signs BP (!) 126/97 (BP Location: Left Arm)   Pulse 68   Temp 99 F (37.2 C) (Oral)   Resp 20   SpO2 98%      Physical Exam Vitals and nursing note reviewed.  Constitutional:      General: He is not in acute distress.    Appearance: Normal appearance. He is well-developed. He is not ill-appearing.  HENT:     Head: Normocephalic and atraumatic.  Eyes:     General: No scleral icterus.    Conjunctiva/sclera: Conjunctivae normal.  Cardiovascular:     Rate and Rhythm: Normal rate and regular rhythm.     Heart sounds: Normal heart sounds.  Pulmonary:     Effort: Pulmonary effort is normal. No respiratory distress.     Breath sounds: Normal breath sounds.  Abdominal:     Palpations: Abdomen is soft.     Tenderness: There is abdominal tenderness in the left lower quadrant. There is no right CVA tenderness, left CVA tenderness, guarding or rebound.  Musculoskeletal:     Cervical back: Neck supple.  Skin:    General: Skin is warm and dry.  Neurological:     General: No focal deficit present.     Mental Status: He is alert. Mental status is at baseline.     Gait: Gait normal.  Psychiatric:        Mood and Affect: Mood normal.        Behavior: Behavior normal.        Thought Content: Thought content normal.     UC Treatments / Results  Labs (all labs ordered are listed, but only abnormal results are displayed) Labs Reviewed  URINALYSIS, COMPLETE (UACMP) WITH MICROSCOPIC - Abnormal; Notable for the following components:      Result Value   Specific Gravity, Urine <1.005 (*)    Glucose, UA >1,000 (*)    Bacteria, UA RARE (*)    All other  components within normal limits  GLUCOSE, CAPILLARY - Abnormal; Notable for the following components:   Glucose-Capillary 144 (*)    All other components within normal limits    EKG   Radiology No results found.  Procedures Procedures (including critical care time)  Medications Ordered in UC Medications - No data to display  Initial Impression / Assessment and Plan / UC Course  I have reviewed the triage vital signs and the nursing notes.  Pertinent labs & imaging results that were available during my care of the patient were reviewed by me and considered in my medical decision making (see chart for details).  50 year old male presenting for left upper quadrant and left lower quadrant abdominal pain for the past 3 to 4 days.  Symptoms are improving and not worsening.  Denies any fever, change in appetite, nausea/vomiting or diarrhea.  Also no dark stools or blood in the stool.  No associated urinary symptoms.  Patient is well-appearing in exam room.  Vital signs are normal and stable.  Exam significant for mild left lower quadrant tenderness.  No rebound or guarding.  No CVA tenderness.  Urinalysis obtained today shows greater than 1000 glucose.  Patient does take Jardiance which could explain the glucose urea.  Fingerstick glucose was 144.  Patient does not exhibit any red flag signs or symptoms on exam or history.  Suspect gastritis/colitis.  Treating patient with supportive care at this time with increasing rest and fluids, Tylenol for discomfort.  Also sent in Bentyl for discomfort/cramping.  Sent in pantoprazole for suspected acid reflux.  Spoke with patient about ED precautions and advised him that if he is not feeling better over the next couple of days or his symptoms worsen he may need to go to the emergency department for labs and imaging if warranted.  Patient agrees.   Final Clinical Impressions(s) / UC Diagnoses   Final diagnoses:  Left lower quadrant abdominal pain   Left upper quadrant abdominal pain     Discharge Instructions      ABDOMINAL PAIN: As we discussed there are a lot of different causes for abdominal pain.  Since you are having some left upper quadrant pain, this could be related to acid reflux and/or just general inflammation of the stomach.  Since you are having left lower quadrant tenderness/pain this could be due to colitis or needing to have a bowel movement.  It is a good sign that your symptoms have improved and not worsened over the past couple of days.  No red flag signs or symptoms based on your presentation today.  We have sent in some medications to help with your symptoms but if you are not improving over the next 2 days your symptoms worsen you may need to go to the emergency department.  Use medications as directed You must increase fluids and electrolyte replacement, as well as rest over these next several days. If you have any questions or concerns, or if your symptoms are not improving or if especially if they acutely worsen, please call or stop back to the clinic immediately and we will be happy to help you or go to the ER   ABDOMINAL PAIN RED FLAGS: Seek immediate further care if: symptoms last more than 3-4 days, you are unable to keep fluids down, you see blood or mucus in your stool, you vomit black or dark red material, you have a fever of 101.F or higher, you have localized and/or persistent abdominal pain      ED Prescriptions     Medication Sig Dispense Auth. Provider   dicyclomine (BENTYL) 20 MG tablet Take 1 tablet (20 mg total) by mouth 4 (four) times daily -  before meals and at bedtime for 5  days. 20 tablet Laurene Footman B, PA-C   pantoprazole (PROTONIX) 20 MG tablet Take 1 tablet (20 mg total) by mouth daily for 14 days. 14 tablet Gretta Cool      PDMP not reviewed this encounter.   Danton Clap, PA-C 01/22/21 1030

## 2021-01-22 NOTE — Discharge Instructions (Addendum)
ABDOMINAL PAIN: As we discussed there are a lot of different causes for abdominal pain.  Since you are having some left upper quadrant pain, this could be related to acid reflux and/or just general inflammation of the stomach.  Since you are having left lower quadrant tenderness/pain this could be due to colitis or needing to have a bowel movement.  It is a good sign that your symptoms have improved and not worsened over the past couple of days.  No red flag signs or symptoms based on your presentation today.  We have sent in some medications to help with your symptoms but if you are not improving over the next 2 days your symptoms worsen you may need to go to the emergency department.  Use medications as directed You must increase fluids and electrolyte replacement, as well as rest over these next several days. If you have any questions or concerns, or if your symptoms are not improving or if especially if they acutely worsen, please call or stop back to the clinic immediately and we will be happy to help you or go to the ER   ABDOMINAL PAIN RED FLAGS: Seek immediate further care if: symptoms last more than 3-4 days, you are unable to keep fluids down, you see blood or mucus in your stool, you vomit black or dark red material, you have a fever of 101.F or higher, you have localized and/or persistent abdominal pain

## 2021-01-22 NOTE — ED Triage Notes (Signed)
Pt presents today with c/o of mid abdominal pain described as cramping x 3 days ago. Denies n/v/d. LBM: 01/21/21 Denies dysuria or fever.

## 2021-02-03 ENCOUNTER — Other Ambulatory Visit: Payer: Self-pay | Admitting: Family Medicine

## 2021-02-03 DIAGNOSIS — E119 Type 2 diabetes mellitus without complications: Secondary | ICD-10-CM

## 2021-02-03 NOTE — Telephone Encounter (Signed)
Requested Prescriptions  Pending Prescriptions Disp Refills  . JARDIANCE 25 MG TABS tablet [Pharmacy Med Name: JARDIANCE 25 MG TABLET] 90 tablet 0    Sig: TAKE 1 TABLET BY MOUTH [place] DAY     Endocrinology:  Diabetes - SGLT2 Inhibitors Failed - 02/03/2021  3:26 PM      Failed - AA eGFR in normal range and within 360 days    GFR calc Af Amer  Date Value Ref Range Status  10/18/2019 80 >59 mL/min/1.73 Final   GFR calc non Af Amer  Date Value Ref Range Status  10/18/2019 69 >59 mL/min/1.73 Final   eGFR  Date Value Ref Range Status  12/08/2020 75 >59 mL/min/1.73 Final         Passed - Cr in normal range and within 360 days    Creatinine, Ser  Date Value Ref Range Status  12/08/2020 1.19 0.76 - 1.27 mg/dL Final   Creatinine, POC  Date Value Ref Range Status  09/29/2018 n/a mg/dL Final         Passed - LDL in normal range and within 360 days    LDL Chol Calc (NIH)  Date Value Ref Range Status  12/08/2020 91 0 - 99 mg/dL Final         Passed - HBA1C is between 0 and 7.9 and within 180 days    Hemoglobin A1C  Date Value Ref Range Status  12/08/2020 7.8 (A) 4.0 - 5.6 % Final   Hgb A1c MFr Bld  Date Value Ref Range Status  10/10/2014 6.7 (A) 4.0 - 6.0 % Final         Passed - Valid encounter within last 6 months    Recent Outpatient Visits          1 month ago Type 2 diabetes mellitus with diabetic neuropathy, without long-term current use of insulin ([place])   [facility] Birdie Sons, MD   6 months ago Headache around the eyes   [place], Vickki Muff, PA-C   7 months ago Type 2 diabetes mellitus with diabetic neuropathy, without long-term current use of insulin ([place])   [facility] Birdie Sons, MD   11 months ago Type 2 diabetes mellitus with diabetic neuropathy, without long-term current use of insulin ([place])   [facility] Birdie Sons, MD   1 year ago Type 2 diabetes mellitus with diabetic  neuropathy, without long-term current use of insulin [facility])   [facility] Birdie Sons, MD      Future Appointments            In 3 months Fisher, Kirstie Peri, MD [facility] [place]

## 2021-02-20 ENCOUNTER — Telehealth: Payer: Self-pay | Admitting: Family Medicine

## 2021-02-20 DIAGNOSIS — E119 Type 2 diabetes mellitus without complications: Secondary | ICD-10-CM

## 2021-02-20 DIAGNOSIS — E876 Hypokalemia: Secondary | ICD-10-CM

## 2021-02-20 MED ORDER — POTASSIUM CHLORIDE CRYS ER 20 MEQ PO TBCR: 20.0000 meq | EXTENDED_RELEASE_TABLET | Freq: Every day | ORAL | 1 refills | Status: DC

## 2021-02-20 MED ORDER — EMPAGLIFLOZIN 25 MG PO TABS: 25.0000 mg | ORAL_TABLET | Freq: Every day | ORAL | 1 refills | Status: DC

## 2021-02-20 NOTE — Telephone Encounter (Signed)
[facility] faxed refill request for the following medications:   JARDIANCE 25 MG TABS tablet   KLOR-CON M20 20 MEQ tablet  Not on current med list   Please advise.

## 2021-02-22 MED ORDER — POTASSIUM CHLORIDE CRYS ER 20 MEQ PO TBCR: 20.0000 meq | EXTENDED_RELEASE_TABLET | Freq: Every day | ORAL | 1 refills | Status: DC

## 2021-02-22 MED ORDER — EMPAGLIFLOZIN 25 MG PO TABS: 25.0000 mg | ORAL_TABLET | Freq: Every day | ORAL | 1 refills | Status: DC

## 2021-02-22 NOTE — Addendum Note (Signed)
Addended by: Ashley Royalty E on: 02/22/2021 10:26 AM   Modules accepted: Orders

## 2021-02-22 NOTE — Telephone Encounter (Signed)
[facility] faxed refill request for the following medications:   JARDIANCE 25 MG TABS tablet    KLOR-CON M20 20 MEQ tablet  Not on current med list    Please advise.

## 2021-04-11 ENCOUNTER — Other Ambulatory Visit: Payer: Self-pay | Admitting: Family Medicine

## 2021-05-14 ENCOUNTER — Encounter: Payer: Self-pay | Admitting: Family Medicine

## 2021-05-14 ENCOUNTER — Other Ambulatory Visit: Payer: Self-pay

## 2021-05-14 ENCOUNTER — Ambulatory Visit: Payer: 59 | Admitting: Family Medicine

## 2021-05-14 VITALS — BP 120/84 | HR 68 | Wt 226.0 lb

## 2021-05-14 DIAGNOSIS — Z23 Encounter for immunization: Secondary | ICD-10-CM

## 2021-05-14 DIAGNOSIS — E1140 Type 2 diabetes mellitus with diabetic neuropathy, unspecified: Secondary | ICD-10-CM | POA: Diagnosis not present

## 2021-05-14 DIAGNOSIS — I10 Essential (primary) hypertension: Secondary | ICD-10-CM

## 2021-05-14 LAB — POCT GLYCOSYLATED HEMOGLOBIN (HGB A1C): Hemoglobin A1C: 8.4 % — AB (ref 4.0–5.6)

## 2021-05-14 NOTE — Progress Notes (Signed)
Established patient visit   Patient: Douglas Murray   DOB: [birth year]   50 y.o. Male  MRN: [id number] Visit Date: 05/14/2021  Today's healthcare provider: Lelon Huh, MD   Chief Complaint  Patient presents with   Hyperlipidemia   Diabetes   Subjective    HPI  Diabetes Mellitus Type II, follow-up  Lab Results  Component Value Date   HGBA1C 8.4 (A) 05/14/2021   HGBA1C 7.8 (A) 12/08/2020   HGBA1C 8.2 (A) 06/26/2020   Last seen for diabetes 5 months ago.  Management since then includes no changes, but continue to work on diet and exercise. He reports excellent compliance with treatment. He is not having side effects.   Home blood sugar records:  190's  Episodes of hypoglycemia? No    Current insulin regiment: None Most Recent Eye Exam: Pt states he is due for an appointment soon   --------------------------------------------------------------------------------------------------- Hypertension, follow-up  BP Readings from Last 3 Encounters:  05/14/21 120/84  01/22/21 (!) 126/97  12/08/20 124/81   Wt Readings from Last 3 Encounters:  05/14/21 226 lb (102.5 kg)  12/08/20 230 lb (104.3 kg)  06/26/20 237 lb (107.5 kg)     He was last seen for hypertension 5 months ago.  BP at that visit was 124/81. Management since that visit includes no changes. He reports excellent compliance with treatment. He is not having side effects.  He is exercising. He  adherent to low salt diet.   Outside blood pressures are not being check.  He does not smoke.  Use of agents associated with hypertension: none.   --------------------------------------------------------------------------------------------------- Lipid/Cholesterol, follow-up  Last Lipid Panel: Lab Results  Component Value Date   CHOL 155 12/08/2020   LDLCALC 91 12/08/2020   HDL 26 (L) 12/08/2020   TRIG 222 (H) 12/08/2020    He was last seen for this 5 months ago.  Management since that  visit includes no changes.  He reports excellent compliance with treatment. He is not having side effects.   Symptoms: No appetite changes No foot ulcerations  No chest pain No chest pressure/discomfort  No dyspnea No orthopnea  No fatigue No lower extremity edema  No palpitations No paroxysmal nocturnal dyspnea  No nausea No numbness or tingling of extremity  No polydipsia No polyuria  No speech difficulty No syncope   Last metabolic panel Lab Results  Component Value Date   GLUCOSE 108 (H) 12/08/2020   NA 140 12/08/2020   K 3.4 (L) 12/08/2020   BUN 13 12/08/2020   CREATININE 1.19 12/08/2020   EGFR 75 12/08/2020   GFRNONAA 69 10/18/2019   CALCIUM 10.1 12/08/2020   AST 19 12/08/2020   ALT 33 12/08/2020   The 10-year ASCVD risk score (Arnett DK, et al., 2019) is: 14.6%  ---------------------------------------------------------------------------------------------------     Medications: Outpatient Medications Prior to Visit  Medication Sig   atenolol-chlorthalidone (TENORETIC) 50-25 MG tablet TAKE 1 TABLET BY MOUTH  DAILY   empagliflozin (JARDIANCE) 25 MG TABS tablet Take 1 tablet (25 mg total) by mouth daily.   metFORMIN (GLUCOPHAGE-XR) 500 MG 24 hr tablet TAKE 2 TABLETS BY MOUTH  DAILY WITH BREAKFAST   potassium chloride SA (KLOR-CON M20) 20 MEQ tablet Take 1 tablet (20 mEq total) by mouth daily.   pravastatin (PRAVACHOL) 80 MG tablet TAKE 1 TABLET BY MOUTH  DAILY   dicyclomine (BENTYL) 20 MG tablet Take 1 tablet (20 mg total) by mouth 4 (four) times daily -  before meals and  at bedtime for 5 days.   pantoprazole (PROTONIX) 20 MG tablet Take 1 tablet (20 mg total) by mouth daily for 14 days.   No facility-administered medications prior to visit.    Review of Systems  Constitutional: Negative.   Respiratory: Negative.    Cardiovascular: Negative.   Gastrointestinal: Negative.   Musculoskeletal: Negative.   Allergic/Immunologic: Positive for environmental  allergies.  Neurological:  Positive for headaches. Negative for dizziness and light-headedness.      Objective    BP 120/84 (BP Location: Right Arm, Patient Position: Sitting, Cuff Size: Large)   Pulse 68   Wt 226 lb (102.5 kg)   SpO2 98%   BMI 31.52 kg/m    Physical Exam   General: Appearance:    Obese male in no acute distress  Eyes:    PERRL, conjunctiva/corneas clear, EOM's intact       Lungs:     Clear to auscultation bilaterally, respirations unlabored  Heart:    Normal heart rate. Normal rhythm. No murmurs, rubs, or gallops.    MS:   All extremities are intact.    Neurologic:   Awake, alert, oriented x 3. No apparent focal neurological defect.         Results for orders placed or performed in visit on 05/14/21  POCT glycosylated hemoglobin (Hb A1C)  Result Value Ref Range   Hemoglobin A1C 8.4 (A) 4.0 - 5.6 %    Assessment & Plan     ,1. Type 2 diabetes mellitus with diabetic neuropathy, without long-term current use of insulin (HCC) Not at goal. He has some trouble with diarrhea on metformin. Will continue current dose of now. He is going to work on improving diet and exercising. Discussed adding another medication if not much better at follow up in 4 months.   2. Essential (primary) hypertension Well controlled.  Continue current medications.     States he has already had flu vaccine.   Future Appointments  Date Time Provider [place]  09/18/2021  4:00 PM Caryn Section, Kirstie Peri, MD [facility]        The entirety of the information documented in the History of Present Illness, Review of Systems and Physical Exam were personally obtained by me. Portions of this information were initially documented by the CMA and reviewed by me for thoroughness and accuracy.     Lelon Huh, MD  [facility] [phone] (phone) [phone] (fax)  [place]

## 2021-05-14 NOTE — Patient Instructions (Signed)
.   Please review the attached list of medications and notify my office if there are any errors.   . Please bring all of your medications to every appointment so we can make sure that our medication list is the same as yours.   

## 2021-05-15 NOTE — Addendum Note (Signed)
Addended by: Ashley Royalty E on: 05/15/2021 08:14 AM   Modules accepted: Orders

## 2021-07-04 ENCOUNTER — Ambulatory Visit
Admission: EM | Admit: 2021-07-04 | Discharge: 2021-07-04 | Disposition: A | Discharge status: Home / Self Care | Payer: 59 | Attending: Physician Assistant | Admitting: Physician Assistant

## 2021-07-04 ENCOUNTER — Other Ambulatory Visit: Payer: Self-pay

## 2021-07-04 DIAGNOSIS — H6591 Unspecified nonsuppurative otitis media, right ear: Secondary | ICD-10-CM

## 2021-07-04 DIAGNOSIS — R0981 Nasal congestion: Secondary | ICD-10-CM | POA: Diagnosis not present

## 2021-07-04 MED ORDER — FLUTICASONE PROPIONATE 50 MCG/ACT NA SUSP: 2.0000 | Freq: Every day | NASAL | 0 refills | Status: DC

## 2021-07-04 MED ORDER — PSEUDOEPHEDRINE-GUAIFENESIN ER 60-600 MG PO TB12: 1.0000 | ORAL_TABLET | Freq: Two times a day (BID) | ORAL | 1 refills | Status: AC

## 2021-07-04 NOTE — Discharge Instructions (Signed)
-  You have fluid behind your eardrum.  It is not infected.  I have sent oral decongestants and a nasal decongestant the pharmacy.  You can also use nasal saline and try ibuprofen.  Warm compresses to the ear may also be helpful. - You should return if you are still having discomfort after about 10 days or if you were to develop a fever or ear pain.

## 2021-07-04 NOTE — ED Provider Notes (Signed)
[facility]    CSN: [id number] Arrival date & time: 07/04/21  1527      History   Chief Complaint Chief Complaint  Patient presents with   Nasal Congestion    HPI Douglas Murray is a 50 y.o. male presenting for about a week of nasal congestion and sinus pressure as well as headaches.  Patient reports over the past 2 days he started to have right ear pressure and fullness.  Also reports a whooshing noise.  Patient denies associated pain.  He has not had any fevers.  States that his head pressure and nasal congestion have cleared up.  He has not been coughing.  Reports is difficult to sleep at night due to the ear pressure and whooshing noise.  Mild reduced hearing in this ear.  No drainage from ear.  No injury to ear.  He has not tried any OTC meds for his symptoms.  No known COVID or flu exposure.  No other complaints.  HPI  Past Medical History:  Diagnosis Date   Allergy    Cholelithiasis 2001   Identified on ultrasound. Asymptomatic.   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 Authorizing Provider  atenolol-chlorthalidone (TENORETIC) 50-25 MG tablet TAKE 1 TABLET BY MOUTH  DAILY 04/11/21  Yes Birdie Sons, MD  empagliflozin (JARDIANCE) 25 MG TABS tablet Take 1 tablet (25 mg total) by mouth daily. 02/22/21  Yes Birdie Sons, MD  fluticasone (FLONASE)  50 MCG/ACT nasal spray Place 2 sprays into both nostrils daily for 7 days. 07/04/21 07/11/21 Yes Laurene Footman B, PA-C  metFORMIN (GLUCOPHAGE-XR) 500 MG 24 hr tablet TAKE 2 TABLETS BY MOUTH  DAILY WITH BREAKFAST 04/11/21  Yes Birdie Sons, MD  potassium chloride SA (KLOR-CON M20) 20 MEQ tablet Take 1 tablet (20 mEq total) by mouth daily. 02/22/21  Yes Birdie Sons, MD  pravastatin (PRAVACHOL) 80 MG tablet TAKE 1 TABLET BY MOUTH  DAILY 11/08/20  Yes Birdie Sons, MD  pseudoephedrine-guaifenesin [facility] D) 60-600 MG 12 hr tablet Take 1 tablet by mouth every 12 (twelve) hours for 7 days. 07/04/21 07/11/21 Yes Laurene Footman B, PA-C  pantoprazole (PROTONIX) 20 MG tablet Take 1 tablet (20 mg total) by mouth daily for 14 days. 01/22/21 02/05/21  Danton Clap, PA-C    Family History Family History  Problem Relation Age of Onset   Diabetes Mother    Cancer Maternal Aunt     Social History Social History   Tobacco Use   Smoking status: Never   Smokeless tobacco: Never  Vaping Use   Vaping Use: Never used  Substance Use Topics   Alcohol use: No   Drug use: No     Allergies   Patient has no known allergies.   Review of Systems Review of Systems  Constitutional:  Negative for  fatigue and fever.  HENT:  Positive for congestion, hearing loss, rhinorrhea and sinus pressure. Negative for ear discharge, ear pain, sinus pain and sore throat.   Respiratory:  Negative for cough.   Gastrointestinal:  Negative for diarrhea, nausea and vomiting.  Musculoskeletal:  Negative for myalgias.  Neurological:  Positive for headaches. Negative for dizziness, weakness and light-headedness.  Hematological:  Negative for adenopathy.  Psychiatric/Behavioral:  Positive for sleep disturbance.     Physical Exam Triage Vital Signs ED Triage Vitals  Enc Vitals Group     BP 07/04/21 1606 118/77     Pulse Rate 07/04/21 1606 60     Resp 07/04/21 1606 18     Temp 07/04/21 1606 98.2 F (36.8 C)      Temp Source 07/04/21 1606 Oral     SpO2 07/04/21 1606 99 %     Weight 07/04/21 1610 225 lb (102.1 kg)     Height --      Head Circumference --      Peak Flow --      Pain Score 07/04/21 1609 7     Pain Loc --      Pain Edu? --      Excl. in [place]? --    No data found.  Updated Vital Signs BP 118/77 (BP Location: Right Arm)    Pulse 60    Temp 98.2 F (36.8 C) (Oral)    Resp 18    Wt 225 lb (102.1 kg)    SpO2 99%    BMI 31.38 kg/m      Physical Exam Vitals and nursing note reviewed.  Constitutional:      General: He is not in acute distress.    Appearance: Normal appearance. He is well-developed. He is not ill-appearing or diaphoretic.  HENT:     Head: Normocephalic and atraumatic.     Right Ear: Ear canal and external ear normal. Decreased hearing noted. A middle ear effusion is present. Tympanic membrane is injected.     Left Ear: Hearing, tympanic membrane, ear canal and external ear normal.     Nose: Congestion present.     Mouth/Throat:     Mouth: Mucous membranes are moist.     Pharynx: Oropharynx is clear. Uvula midline.     Tonsils: No tonsillar abscesses.  Eyes:     General: No scleral icterus.       Right eye: No discharge.        Left eye: No discharge.     Conjunctiva/sclera: Conjunctivae normal.  Neck:     Thyroid: No thyromegaly.     Trachea: No tracheal deviation.  Cardiovascular:     Rate and Rhythm: Normal rate and regular rhythm.     Heart sounds: Normal heart sounds.  Pulmonary:     Effort: Pulmonary effort is normal. No respiratory distress.     Breath sounds: Normal breath sounds. No wheezing, rhonchi or rales.  Musculoskeletal:     Cervical back: Neck supple.  Skin:    General: Skin is warm and dry.     Findings: No rash.  Neurological:     General: No focal deficit present.     Mental Status: He is alert.     Motor: No weakness.     Coordination: Coordination normal.     Gait: Gait normal.  Psychiatric:        Mood and Affect: Mood normal.         Behavior: Behavior normal.  Thought Content: Thought content normal.     UC Treatments / Results  Labs (all labs ordered are listed, but only abnormal results are displayed) Labs Reviewed - No data to display  EKG   Radiology No results found.  Procedures Procedures (including critical care time)  Medications Ordered in UC Medications - No data to display  Initial Impression / Assessment and Plan / UC Course  I have reviewed the triage vital signs and the nursing notes.  Pertinent labs & imaging results that were available during my care of the patient were reviewed by me and considered in my medical decision making (see chart for details).  50 year old male presenting for 1 week history of nasal congestion, sinus pressure and headaches.  Reports 2-day history of right-sided ear pressure and fullness as well as decreased hearing and whooshing noise.  On exam he has effusion of right TM with injection.  Does not appear to be consistent with infection.  Advised treatment with Mucinex D and Flonase which I sent to pharmacy.  Advised returning if no improvement in the next 10 days or if he were to develop ear pain, drainage or fever.  Final Clinical Impressions(s) / UC Diagnoses   Final diagnoses:  Right otitis media with effusion  Nasal congestion     Discharge Instructions      -You have fluid behind your eardrum.  It is not infected.  I have sent oral decongestants and a nasal decongestant the pharmacy.  You can also use nasal saline and try ibuprofen.  Warm compresses to the ear may also be helpful. - You should return if you are still having discomfort after about 10 days or if you were to develop a fever or ear pain.     ED Prescriptions     Medication Sig Dispense Auth. Provider   pseudoephedrine-guaifenesin (MUCINEX D) 60-600 MG 12 hr tablet Take 1 tablet by mouth every 12 (twelve) hours for 7 days. 14 tablet Laurene Footman B, PA-C   fluticasone  (FLONASE) 50 MCG/ACT nasal spray Place 2 sprays into both nostrils daily for 7 days. 1 g Danton Clap, PA-C      PDMP not reviewed this encounter.   Danton Clap, PA-C 07/04/21 1717

## 2021-07-04 NOTE — ED Triage Notes (Signed)
Patient is here for "Nasal Congestion'. Going on "for atleast a week" with "ha" & "ear pain". No fever. No cough. No sob. Hard to sleep at night.

## 2021-07-05 ENCOUNTER — Other Ambulatory Visit: Payer: Self-pay | Admitting: Family Medicine

## 2021-07-19 ENCOUNTER — Other Ambulatory Visit: Payer: Self-pay

## 2021-07-19 ENCOUNTER — Other Ambulatory Visit: Payer: Self-pay | Admitting: Family Medicine

## 2021-07-19 ENCOUNTER — Encounter: Payer: Self-pay | Admitting: Physician Assistant

## 2021-07-19 ENCOUNTER — Ambulatory Visit: Payer: 59 | Admitting: Physician Assistant

## 2021-07-19 VITALS — BP 126/78 | HR 66 | Temp 98.1°F | Resp 16 | Ht 71.0 in | Wt 225.0 lb

## 2021-07-19 DIAGNOSIS — H65193 Other acute nonsuppurative otitis media, bilateral: Secondary | ICD-10-CM | POA: Diagnosis not present

## 2021-07-19 NOTE — Patient Instructions (Addendum)
°  Based on exam it appears that you have something called acute otitis media with effusion which is likely secondary to your recent cold/cough symptoms.   This can be managed with over the counter medications at this time  I recommend using Dayquil / Nyquil or another multi-symptom relief medication to help with your cough and congestion as this will help with relieving pressure in your inner ear  At this time I would recommend tapering the Flonase to reduce the chances of rebound congestion and nasal irritation. Just do one spray in each nostril for 2 days then do one spray every other day for 2 days before stopping completely.   Usually these symptoms will resolve over time but if you continue to have hearing trouble, ear pressure or develop ear pain please make a follow up apt with [place] so we can do further evaluation  If you develop the following symptoms please go the to ER: swelling around your eyes and nose, trouble breathing or high fever, shortness of breath, changes to your vision, swelling behind your ears, or fainting.

## 2021-07-19 NOTE — Progress Notes (Signed)
Established patient visit   Patient: Douglas Murray   DOB: 12-20-1970   50 y.o. Male  MRN: [id number] Visit Date: 07/19/2021  Today's healthcare provider: Dani Gobble Jenney Brester, PA-C   Introduced myself to the patient as a Journalist, newspaper and provided education on APPs in clinical practice.    Chief Complaint  Patient presents with   Ear Fullness   Subjective    HPI  Patient is here concerning bilateral ear fullness times 2 weeks. Denies ear pain, ear drainage States it began as frontal sinus pain and pressure and has "moved " to ear pressure States he is having difficulty hearing sounds farther than 10 feet away Was seen at [facility] for congestion, headaches, rhinorrhea  He is still using Flonase - discussed reducing use of Flonase to reduce risk of rhinitis medicamentosa        Medications: Outpatient Medications Prior to Visit  Medication Sig   atenolol-chlorthalidone (TENORETIC) 50-25 MG tablet TAKE 1 TABLET BY MOUTH  DAILY   empagliflozin (JARDIANCE) 25 MG TABS tablet Take 1 tablet (25 mg total) by mouth daily.   fluticasone (FLONASE) 50 MCG/ACT nasal spray Place 2 sprays into both nostrils daily.   metFORMIN (GLUCOPHAGE-XR) 500 MG 24 hr tablet TAKE 2 TABLETS BY MOUTH  DAILY WITH BREAKFAST   potassium chloride SA (KLOR-CON M20) 20 MEQ tablet Take 1 tablet (20 mEq total) by mouth daily.   pravastatin (PRAVACHOL) 80 MG tablet TAKE 1 TABLET BY MOUTH  DAILY   [DISCONTINUED] fluticasone (FLONASE) 50 MCG/ACT nasal spray Place 2 sprays into both nostrils daily for 7 days.   [DISCONTINUED] pantoprazole (PROTONIX) 20 MG tablet Take 1 tablet (20 mg total) by mouth daily for 14 days.   No facility-administered medications prior to visit.    Review of Systems  Constitutional:  Negative for fatigue and fever.  HENT:  Positive for postnasal drip and tinnitus. Negative for ear discharge, ear pain and rhinorrhea.   Respiratory:  Positive for cough.       Objective    BP 126/78  (BP Location: Right Arm, Patient Position: Sitting, Cuff Size: Large)    Pulse 66    Temp 98.1 F (36.7 C) (Temporal)    Resp 16    Ht 5\' 11"  (1.803 m)    Wt 225 lb (102.1 kg)    SpO2 94%    BMI 31.38 kg/m  {Show previous vital signs (optional):23777}  Physical Exam Constitutional:      Appearance: Normal appearance.  HENT:     Head: Normocephalic and atraumatic.     Right Ear: Ear canal and external ear normal. Tympanic membrane is erythematous. Tympanic membrane is not injected, scarred or perforated.     Left Ear: Tympanic membrane, ear canal and external ear normal.     Nose: Mucosal edema and congestion present.     Mouth/Throat:     Mouth: Mucous membranes are moist.     Pharynx: Oropharynx is clear. Uvula midline.     Tonsils: No tonsillar exudate or tonsillar abscesses.  Cardiovascular:     Rate and Rhythm: Normal rate and regular rhythm.     Pulses: Normal pulses.     Heart sounds: Normal heart sounds.  Pulmonary:     Effort: Pulmonary effort is normal.     Breath sounds: Normal breath sounds and air entry. No decreased breath sounds, wheezing, rhonchi or rales.  Musculoskeletal:     Cervical back: Normal range of motion and neck supple.  Right lower leg: No edema.     Left lower leg: No edema.  Neurological:     Mental Status: He is alert.      No results found for any visits on 07/19/21.  Assessment & Plan     Problem List Items Addressed This Visit   None Visit Diagnoses     Acute otitis media with effusion of both ears    -  Primary      Acute, likely secondary to recent URI type symptoms Recommend OTC multisymptom relief medications to assist with sinus congestion and to help relieve pressure Recommend tapering and discontinuing Flonase to prevent potential rhinitis medicamentosa  If ear pressure and hearing do not improve in 5-7 days recommend follow up in office for further evaluation to include referral to ENT and/or otolaryngology.        The  entirety of the information documented in the History of Present Illness, Review of Systems and Physical Exam were personally obtained by me. Portions of this information were initially documented by the CMA, Douglas Murray, and reviewed by me for thoroughness and accuracy.   Douglas Gobble Davette Nugent, PA-C    Almon Register, PA-C  [employer] [phone] (phone) [phone] (fax)  [place]

## 2021-07-20 NOTE — Telephone Encounter (Signed)
Requested Prescriptions  Pending Prescriptions Disp Refills   pravastatin (PRAVACHOL) 80 MG tablet [Pharmacy Med Name: Pravastatin Sodium 80 MG Oral Tablet] 90 tablet 1    Sig: TAKE 1 TABLET BY MOUTH  DAILY     Cardiovascular:  Antilipid - Statins Failed - 07/19/2021 11:01 PM      Failed - HDL in normal range and within 360 days    HDL  Date Value Ref Range Status  12/08/2020 26 (L) >39 mg/dL Final         Failed - Triglycerides in normal range and within 360 days    Triglycerides  Date Value Ref Range Status  12/08/2020 222 (H) 0 - 149 mg/dL Final         Passed - Total Cholesterol in normal range and within 360 days    Cholesterol, Total  Date Value Ref Range Status  12/08/2020 155 100 - 199 mg/dL Final         Passed - LDL in normal range and within 360 days    LDL Chol Calc (NIH)  Date Value Ref Range Status  12/08/2020 91 0 - 99 mg/dL Final         Passed - Patient is not pregnant      Passed - Valid encounter within last 12 months    Recent Outpatient Visits          Yesterday Acute otitis media with effusion of both ears   [employer], Erin E, PA-C   2 months ago Type 2 diabetes mellitus with diabetic neuropathy, without long-term current use of insulin ([place])   [facility] Birdie Sons, MD   7 months ago Type 2 diabetes mellitus with diabetic neuropathy, without long-term current use of insulin ([place])   [facility] Birdie Sons, MD   1 year ago Headache around the eyes   [place], Vickki Muff, PA-C   1 year ago Type 2 diabetes mellitus with diabetic neuropathy, without long-term current use of insulin [facility])   [place], Kirstie Peri, MD      Future Appointments            In 2 months [place], Kirstie Peri, MD [facility] [place]

## 2021-09-18 ENCOUNTER — Other Ambulatory Visit: Payer: Self-pay

## 2021-09-18 ENCOUNTER — Ambulatory Visit: Payer: 59 | Admitting: Family Medicine

## 2021-09-18 ENCOUNTER — Encounter: Payer: Self-pay | Admitting: Family Medicine

## 2021-09-18 VITALS — BP 121/88 | HR 63 | Temp 98.8°F | Resp 16 | Ht 71.0 in | Wt 227.0 lb

## 2021-09-18 DIAGNOSIS — M79602 Pain in left arm: Secondary | ICD-10-CM | POA: Diagnosis not present

## 2021-09-18 DIAGNOSIS — E1140 Type 2 diabetes mellitus with diabetic neuropathy, unspecified: Secondary | ICD-10-CM | POA: Diagnosis not present

## 2021-09-18 DIAGNOSIS — I10 Essential (primary) hypertension: Secondary | ICD-10-CM | POA: Diagnosis not present

## 2021-09-18 LAB — POCT GLYCOSYLATED HEMOGLOBIN (HGB A1C)
Est. average glucose Bld gHb Est-mCnc: 209
Hemoglobin A1C: 8.9 % — AB (ref 4.0–5.6)

## 2021-09-18 MED ORDER — SAXAGLIPTIN HCL 5 MG PO TABS: 5.0000 mg | ORAL_TABLET | Freq: Every day | ORAL | 3 refills | Status: DC

## 2021-09-18 MED ORDER — NAPROXEN 500 MG PO TABS: 500.0000 mg | ORAL_TABLET | Freq: Two times a day (BID) | ORAL | 1 refills | Status: DC | PRN

## 2021-09-18 NOTE — Progress Notes (Deleted)
I,Roshena L Chambers,acting as a scribe for Lelon Huh, MD.,have documented all relevant documentation on the behalf of Lelon Huh, MD,as directed by  Lelon Huh, MD while in the presence of Lelon Huh, MD.    Established patient visit   Patient: Douglas Murray   DOB: 06/16/1971   51 y.o. Male  MRN: [id number] Visit Date: 09/18/2021  Today's healthcare provider: Lelon Huh, MD   Chief Complaint  Patient presents with   Diabetes   Hypertension   Subjective    HPI  Diabetes Mellitus Type II, Follow-up  Lab Results  Component Value Date   HGBA1C 8.9 (A) 09/18/2021   HGBA1C 8.4 (A) 05/14/2021   HGBA1C 7.8 (A) 12/08/2020   Wt Readings from Last 3 Encounters:  09/18/21 227 lb (103 kg)  07/19/21 225 lb (102.1 kg)  07/04/21 225 lb (102.1 kg)   Last seen for diabetes 4 months ago.  During that visit he mentioned having some trouble with diarrhea on metformin. He was to continue current dose of now and work on improving diet and exercise. Discussed adding another medication if not much better at follow up. He reports {excellent/good/fair/poor:19665} compliance with treatment. He {is/is not:21021397} having side effects. {document side effects if present:1} Symptoms: No fatigue No foot ulcerations  No appetite changes No nausea  No paresthesia of the feet  No polydipsia  No polyuria No visual disturbances   No vomiting     Home blood sugar records: {diabetes glucometry results:16657}  Episodes of hypoglycemia? {Yes/No:20286} {enter symptoms and frequency of symptoms if yes:1}   Current insulin regiment: none Most Recent Eye Exam: *** {Current exercise:16438:::1} {Current diet habits:16563:::1}  Pertinent Labs: Lab Results  Component Value Date   CHOL 155 12/08/2020   HDL 26 (L) 12/08/2020   LDLCALC 91 12/08/2020   TRIG 222 (H) 12/08/2020   CHOLHDL 6.0 (H) 12/08/2020   Lab Results  Component Value Date   NA 140 12/08/2020   K 3.4 (L)  12/08/2020   CREATININE 1.19 12/08/2020   EGFR 75 12/08/2020   MICROALBUR 20 12/08/2020     ---------------------------------------------------------------------------------------------------   Hypertension, follow-up  BP Readings from Last 3 Encounters:  09/18/21 121/88  07/19/21 126/78  07/04/21 118/77   Wt Readings from Last 3 Encounters:  09/18/21 227 lb (103 kg)  07/19/21 225 lb (102.1 kg)  07/04/21 225 lb (102.1 kg)     He was last seen for hypertension 4 months ago.  BP at that visit was 120/84. Management since that visit includes continue same medication.  He reports {excellent/good/fair/poor:19665} compliance with treatment. He {is/is not:9024} having side effects. {document side effects if present:1} He is following a {diet:21022986} diet. He {is/is not:9024} exercising. He {does/does not:200015} smoke.  Use of agents associated with hypertension: {bp agents assoc with hypertension:511::"none"}.   Outside blood pressures are {***enter patient reported home BP readings, or 'not being checked':1}. Symptoms: No chest pain No chest pressure  No palpitations No syncope  No dyspnea No orthopnea  No paroxysmal nocturnal dyspnea No lower extremity edema   Pertinent labs: Lab Results  Component Value Date   CHOL 155 12/08/2020   HDL 26 (L) 12/08/2020   LDLCALC 91 12/08/2020   TRIG 222 (H) 12/08/2020   CHOLHDL 6.0 (H) 12/08/2020   Lab Results  Component Value Date   NA 140 12/08/2020   K 3.4 (L) 12/08/2020   CREATININE 1.19 12/08/2020   EGFR 75 12/08/2020   GLUCOSE 108 (H) 12/08/2020   TSH 1.580  12/08/2020     The 10-year ASCVD risk score (Arnett DK, et al., 2019) is: 15.6%   ---------------------------------------------------------------------------------------------------   Medications: Outpatient Medications Prior to Visit  Medication Sig   atenolol-chlorthalidone (TENORETIC) 50-25 MG tablet TAKE 1 TABLET BY MOUTH  DAILY   empagliflozin (JARDIANCE)  25 MG TABS tablet Take 1 tablet (25 mg total) by mouth daily.   metFORMIN (GLUCOPHAGE-XR) 500 MG 24 hr tablet TAKE 2 TABLETS BY MOUTH  DAILY WITH BREAKFAST   potassium chloride SA (KLOR-CON M20) 20 MEQ tablet Take 1 tablet (20 mEq total) by mouth daily.   pravastatin (PRAVACHOL) 80 MG tablet TAKE 1 TABLET BY MOUTH  DAILY   fluticasone (FLONASE) 50 MCG/ACT nasal spray Place 2 sprays into both nostrils daily. (Patient not taking: Reported on 09/18/2021)   No facility-administered medications prior to visit.    Review of Systems  Constitutional:  Negative for appetite change, chills and fever.  Respiratory:  Negative for chest tightness, shortness of breath and wheezing.   Cardiovascular:  Negative for chest pain and palpitations.  Gastrointestinal:  Negative for abdominal pain, nausea and vomiting.   {Labs   Heme   Chem   Endocrine   Serology   Results Review (optional):23779}   Objective    BP 121/88 (BP Location: Left Arm, Patient Position: Sitting, Cuff Size: Normal)    Pulse 63    Temp 98.8 F (37.1 C) (Oral)    Resp 16    Ht _0  (1.803 m)    Wt 227 lb (103 kg)    SpO2 99%    BMI 31.66 kg/m  {Show previous vital signs (optional):23777}  Physical Exam  ***  Results for orders placed or performed in visit on 09/18/21  POCT HgB A1C  Result Value Ref Range   Hemoglobin A1C 8.9 (A) 4.0 - 5.6 %   Est. average glucose Bld gHb Est-mCnc 209     Assessment & Plan     ***  No follow-ups on file.      {provider attestation***:1}   Lelon Huh, MD  [facility] [phone] (phone) [phone] (fax)  [place]

## 2021-09-18 NOTE — Progress Notes (Signed)
Established patient visit   Patient: Douglas Murray   DOB: 08/13/1970   51 y.o. Male  MRN: [id number] Visit Date: 09/18/2021  Today's healthcare provider: Lelon Huh, MD   No chief complaint on file.  Subjective    HPI  Diabetes Mellitus Type II, Follow-up  Lab Results  Component Value Date   HGBA1C 8.4 (A) 05/14/2021   HGBA1C 7.8 (A) 12/08/2020   HGBA1C 8.2 (A) 06/26/2020   Wt Readings from Last 3 Encounters:  07/19/21 225 lb (102.1 kg)  07/04/21 225 lb (102.1 kg)  05/14/21 226 lb (102.5 kg)   Last seen for diabetes 4 months ago.  Management since then includes; Not at goal. He had some trouble with diarrhea on metformin. Continue current dose of now. Woik on improving diet and exercising. Discussed adding another medication if not much better at follow up in 4 months.   He reports good compliance with treatment. He is not having side effects.   Home blood sugar records:  not checked  Episodes of hypoglycemia? No     Most Recent Eye Exam: 04/28/2020 Current exercise: none Current diet habits: low salt  Pertinent Labs: Lab Results  Component Value Date   CHOL 155 12/08/2020   HDL 26 (L) 12/08/2020   LDLCALC 91 12/08/2020   TRIG 222 (H) 12/08/2020   CHOLHDL 6.0 (H) 12/08/2020   Lab Results  Component Value Date   NA 140 12/08/2020   K 3.4 (L) 12/08/2020   CREATININE 1.19 12/08/2020   EGFR 75 12/08/2020   MICROALBUR 20 12/08/2020     ---------------------------------------------------------------------------------------------------  Hypertension, follow-up  BP Readings from Last 3 Encounters:  07/19/21 126/78  07/04/21 118/77  05/14/21 120/84   Wt Readings from Last 3 Encounters:  07/19/21 225 lb (102.1 kg)  07/04/21 225 lb (102.1 kg)  05/14/21 226 lb (102.5 kg)     He was last seen for hypertension 4 months ago.  BP at that visit was 120/84. Management since that visit includes; continue current medications.  He reports good  compliance with treatment. He is not having side effects. He is following a Low Sodium diet. He is not exercising. He does not smoke.  Outside blood pressures are not checked  ---------------------------------------------------------------------------------------------------   Medications: Outpatient Medications Prior to Visit  Medication Sig   atenolol-chlorthalidone (TENORETIC) 50-25 MG tablet TAKE 1 TABLET BY MOUTH  DAILY   empagliflozin (JARDIANCE) 25 MG TABS tablet Take 1 tablet (25 mg total) by mouth daily.   fluticasone (FLONASE) 50 MCG/ACT nasal spray Place 2 sprays into both nostrils daily.   metFORMIN (GLUCOPHAGE-XR) 500 MG 24 hr tablet TAKE 2 TABLETS BY MOUTH  DAILY WITH BREAKFAST   potassium chloride SA (KLOR-CON M20) 20 MEQ tablet Take 1 tablet (20 mEq total) by mouth daily.   pravastatin (PRAVACHOL) 80 MG tablet TAKE 1 TABLET BY MOUTH  DAILY   No facility-administered medications prior to visit.      Objective    BP 121/88 (BP Location: Left Arm, Patient Position: Sitting, Cuff Size: Normal)    Pulse 63    Temp 98.8 F (37.1 C) (Oral)    Resp 16    Ht 5' 11" (1.803 m)    Wt 227 lb (103 kg)    SpO2 99%    BMI 31.66 kg/m    Physical Exam  General appearance: Mildly obese male, cooperative and in no acute distress Head: Normocephalic, without obvious abnormality, atraumatic Respiratory: Respirations even and unlabored, normal  respiratory rate Extremities: All extremities are intact.  Skin: Skin color, texture, turgor normal. No rashes seen  Psych: Appropriate mood and affect. Neurologic: Mental status: Alert, oriented to person, place, and time, thought content appropriate.    Results for orders placed or performed in visit on 09/18/21  POCT HgB A1C  Result Value Ref Range   Hemoglobin A1C 8.9 (A) 4.0 - 5.6 %   Est. average glucose Bld gHb Est-mCnc 209      Assessment & Plan     1. Type 2 diabetes mellitus with diabetic neuropathy, without long-term current  use of insulin (HCC)  - Urine Microalbumin w/creat. ratio  A1c is worsening, will add saxagliptin HCl (ONGLYZA) 5 MG TABS tablet; Take 1 tablet (5 mg total) by mouth daily.  Dispense: 30 tablet; Refill: 3  2. Left arm pain He reports this is intermittent and runs from back of shoulder down length or arm, likely neuralgia - naproxen (NAPROSYN) 500 MG tablet; Take 1 tablet (500 mg total) by mouth 2 (two) times daily as needed.  Dispense: 30 tablet; Refill: 1  3. Essential (primary) hypertension Well controlled.  Continue current medications.    Future Appointments  Date Time Provider [place]  01/01/2022  4:00 PM Caryn Section Kirstie Peri, MD [facility]         The entirety of the information documented in the History of Present Illness, Review of Systems and Physical Exam were personally obtained by me. Portions of this information were initially documented by the CMA and reviewed by me for thoroughness and accuracy.     Lelon Huh, MD  [facility] [phone] (phone) [phone] (fax)  [place]

## 2021-09-19 LAB — MICROALBUMIN / CREATININE URINE RATIO
Creatinine, Urine: 57.7 mg/dL
Microalb/Creat Ratio: 12 mg/g creat (ref 0–29)
Microalbumin, Urine: 7 ug/mL

## 2021-09-27 ENCOUNTER — Telehealth: Payer: Self-pay

## 2021-09-27 NOTE — Telephone Encounter (Signed)
Message left for the patient letting him know he is due for his Colonoscopy. Last one done with Dr Bary Castilla. Need to know if he wants to see another provider for this or to return to Dr Bary Castilla who is at [facility].  ?

## 2021-10-24 ENCOUNTER — Other Ambulatory Visit: Payer: Self-pay | Admitting: Family Medicine

## 2021-10-24 DIAGNOSIS — E876 Hypokalemia: Secondary | ICD-10-CM

## 2021-11-19 ENCOUNTER — Other Ambulatory Visit: Payer: Self-pay | Admitting: Family Medicine

## 2021-11-19 DIAGNOSIS — E119 Type 2 diabetes mellitus without complications: Secondary | ICD-10-CM

## 2022-01-01 ENCOUNTER — Ambulatory Visit: Payer: 59 | Admitting: Family Medicine

## 2022-01-06 ENCOUNTER — Other Ambulatory Visit: Payer: Self-pay | Admitting: Family Medicine

## 2022-01-28 ENCOUNTER — Ambulatory Visit: Payer: Self-pay

## 2022-01-28 ENCOUNTER — Telehealth: Payer: 59 | Admitting: Physician Assistant

## 2022-01-28 DIAGNOSIS — U071 COVID-19: Secondary | ICD-10-CM | POA: Diagnosis not present

## 2022-01-28 MED ORDER — MOLNUPIRAVIR EUA 200MG CAPSULE: 4.0000 | ORAL_CAPSULE | Freq: Two times a day (BID) | ORAL | 0 refills | Status: AC

## 2022-01-28 NOTE — Telephone Encounter (Signed)
  Chief Complaint: COVID + Symptoms: Cough, fever, Sinus pressure Frequency: yesterday Pertinent Negatives: Patient denies SOB Disposition: '[]'$ ED /'[]'$ Urgent Care (no appt availability in office) / '[x]'$ Appointment(In office/virtual)/ '[]'$  [facility]/ '[]'$ Home Care/ '[]'$ Refused Recommended Disposition /'[]'$ [facility]/ '[]'$  Follow-up with PCP Additional Notes: Pt began S/S yesterday afternoon. Pt is interested in antiviral medications d/t DM. Summary: pt covid/ wants sooner appt call to advise   Pt tested positive covid today/ pt states ins wants him to get appt asap as is diabetic. Gave pt virtual appt for tomorrow but he really wants something sooner. Pls FU with pt to advise. [phone]      Reason for Disposition  [1] HIGH RISK for severe COVID complications (e.g., weak immune system, age > 77 years, obesity with BMI > 25, pregnant, chronic lung disease or other chronic medical condition) AND [2] COVID symptoms (e.g., cough, fever)  (Exceptions: Already seen by PCP and no new or worsening symptoms.)  Answer Assessment - Initial Assessment Questions 1. COVID-19 DIAGNOSIS: "Who made your COVID-19 diagnosis?" "Was it confirmed by a positive lab test or self-test?" If not diagnosed by a doctor (or NP/PA), ask "Are there lots of cases (community spread) where you live?" Note: See public health department website, if unsure.     Home test 2. COVID-19 EXPOSURE: "Was there any known exposure to COVID before the symptoms began?" CDC Definition of close contact: within 6 feet (2 meters) for a total of 15 minutes or more over a 24-hour period.      no 3. ONSET: "When did the COVID-19 symptoms start?"      yesterday 4. WORST SYMPTOM: "What is your worst symptom?" (e.g., cough, fever, shortness of breath, muscle aches)     Cough, HA sinus pressure 5. COUGH: "Do you have a cough?" If Yes, ask: "How bad is the cough?"       yes 6. FEVER: "Do you have a fever?" If Yes, ask: "What is your  temperature, how was it measured, and when did it start?"     yes 7. RESPIRATORY STATUS: "Describe your breathing?" (e.g., shortness of breath, wheezing, unable to speak)      ok 8. BETTER-SAME-WORSE: "Are you getting better, staying the same or getting worse compared to yesterday?"  If getting worse, ask, "In what way?"     worse 9. HIGH RISK DISEASE: "Do you have any chronic medical problems?" (e.g., asthma, heart or lung disease, weak immune system, obesity, etc.)     Diabetes 10. VACCINE: "Have you had the COVID-19 vaccine?" If Yes, ask: "Which one, how many shots, when did you get it?"        11. BOOSTER: "Have you received your COVID-19 booster?" If Yes, ask: "Which one and when did you get it?"        12. PREGNANCY: "Is there any chance you are pregnant?" "When was your last menstrual period?"       na 13. OTHER SYMPTOMS: "Do you have any other symptoms?"  (e.g., chills, fatigue, headache, loss of smell or taste, muscle pain, sore throat)       HA, Cough, sinus pressure chills 14. O2 SATURATION MONITOR:  "Do you use an oxygen saturation monitor (pulse oximeter) at home?" If Yes, ask "What is your reading (oxygen level) today?" "What is your usual oxygen saturation reading?" (e.g., 95%)  Protocols used: Coronavirus (COVID-19) Diagnosed or Suspected-A-AH

## 2022-01-28 NOTE — Telephone Encounter (Signed)
Please Review

## 2022-01-28 NOTE — Telephone Encounter (Signed)
Looks like he had a virtual visit with Douglas Murray today. Does he want to cancel tomorrow's appointment

## 2022-01-28 NOTE — Progress Notes (Signed)
Virtual Visit Consent   Douglas Murray, you are scheduled for a virtual visit with a [facility] provider today. Just as with appointments in the office, your consent must be obtained to participate. Your consent will be active for this visit and any virtual visit you may have with one of our providers in the next 365 days. If you have a MyChart account, a copy of this consent can be sent to you electronically.  As this is a virtual visit, video technology does not allow for your provider to perform a traditional examination. This may limit your provider's ability to fully assess your condition. If your provider identifies any concerns that need to be evaluated in person or the need to arrange testing (such as labs, EKG, etc.), we will make arrangements to do so. Although advances in technology are sophisticated, we cannot ensure that it will always work on either your end or our end. If the connection with a video visit is poor, the visit may have to be switched to a telephone visit. With either a video or telephone visit, we are not always able to ensure that we have a secure connection.  By engaging in this virtual visit, you consent to the provision of healthcare and authorize for your insurance to be billed (if applicable) for the services provided during this visit. Depending on your insurance coverage, you may receive a charge related to this service.  I need to obtain your verbal consent now. Are you willing to proceed with your visit today? Patricio Popwell has provided verbal consent on 01/28/2022 for a virtual visit (video or telephone). Mar Daring, PA-C  Date: 01/28/2022 3:29 PM  Virtual Visit via Video Note   I, Mar Daring, connected with  Douglas Murray  ([id number], 1970-12-10) on 01/28/22 at  3:15 PM EDT by a video-enabled telemedicine application and verified that I am speaking with the correct person using two  identifiers.  Location: Patient: Virtual Visit Location Patient: Home Provider: Virtual Visit Location Provider: Home Office   I discussed the limitations of evaluation and management by telemedicine and the availability of in person appointments. The patient expressed understanding and agreed to proceed.    History of Present Illness: Douglas Murray is a 51 y.o. who identifies as a male who was assigned male at birth, and is being seen today for Covid 63.  HPI: URI  This is a new problem. The current episode started yesterday (tested positive for Covid 19 today on at home test x 2). The problem has been gradually worsening. The maximum temperature recorded prior to his arrival was 103 - 104 F (104 last night; today has been normal 97). The fever has been present for Less than 1 day. Associated symptoms include congestion, coughing, headaches, rhinorrhea, sinus pain, sneezing and a sore throat. Pertinent negatives include no diarrhea, ear pain, nausea, neck pain, plugged ear sensation or vomiting. Associated symptoms comments: Chills. Treatments tried: coricidin hbp. The treatment provided no relief.      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 (Patient not taking: Reported on 09/18/2021), Disp: , Rfl:    JARDIANCE 25 MG TABS tablet, TAKE 1 TABLET (25 MG TOTAL) BY MOUTH DAILY., Disp: 90 tablet, Rfl: 4   KLOR-CON M20 20 MEQ tablet, TAKE 1 TABLET BY MOUTH EVERY DAY, Disp: 90 tablet, Rfl: 3   metFORMIN (GLUCOPHAGE-XR) 500 MG 24 hr tablet, TAKE 2 TABLETS BY MOUTH  DAILY WITH BREAKFAST, Disp: 180 tablet, Rfl: 3   naproxen (NAPROSYN) 500 MG tablet, Take 1 tablet (500 mg total) by mouth 2 (two) times daily as needed., Disp: 30 tablet, Rfl: 1   pravastatin (PRAVACHOL) 80 MG tablet, TAKE 1 TABLET BY MOUTH DAILY, Disp: 90 tablet, Rfl: 4   saxagliptin HCl (ONGLYZA) 5 MG TABS tablet, Take 1 tablet (5 mg total) by mouth daily., Disp: 30 tablet, Rfl: 3  Observations/Objective: Patient is well-developed, well-nourished in no acute distress.  Resting comfortably at home.  Head is normocephalic, atraumatic.  No labored breathing.  Speech is clear and coherent with logical content.  Patient is alert and oriented at baseline.    Assessment and Plan: 1. COVID-19 - MyChart COVID-19 home monitoring program; Future - molnupiravir EUA (LAGEVRIO) 200 mg CAPS capsule; Take 4 capsules (800 mg total) by mouth 2 (two) times daily for 5 days.  Dispense: 40 capsule; Refill: 0  - Continue OTC symptomatic management of choice - Will send OTC vitamins and supplement information through AVS - Molnupiravir prescribed - Patient enrolled in MyChart symptom monitoring - Push fluids - Rest as needed - Discussed return precautions and when to seek in-person evaluation, sent via AVS as well   Follow Up Instructions: I discussed the assessment and treatment plan with the patient. The patient was provided an opportunity to ask questions and all were answered. The patient agreed with the plan and demonstrated an understanding of the instructions.  A copy of instructions were sent to the patient via MyChart unless otherwise  noted below.    The patient was advised to call back or seek an in-person evaluation if the symptoms worsen or if the condition fails to improve as anticipated.  Time:  I spent 15 minutes with the patient via telehealth technology discussing the above problems/concerns.    Mar Daring, PA-C

## 2022-01-28 NOTE — Patient Instructions (Signed)
Whitman Hero, thank you for joining Mar Daring, PA-C for today's virtual visit.  While this provider is not your primary care provider (PCP), if your PCP is located in our provider database this encounter information will be shared with them immediately following your visit.  Consent: (Patient) Douglas Murray provided verbal consent for this virtual visit at the beginning of the encounter.  Current Medications:  Current Outpatient Medications:    molnupiravir EUA (LAGEVRIO) 200 mg CAPS capsule, Take 4 capsules (800 mg total) by mouth 2 (two) times daily for 5 days., Disp: 40 capsule, Rfl: 0   atenolol-chlorthalidone (TENORETIC) 50-25 MG tablet, TAKE 1 TABLET BY MOUTH  DAILY, Disp: 90 tablet, Rfl: 3   fluticasone (FLONASE) 50 MCG/ACT nasal spray, Place 2 sprays into both nostrils daily. (Patient not taking: Reported on 09/18/2021), Disp: , Rfl:    JARDIANCE 25 MG TABS tablet, TAKE 1 TABLET (25 MG TOTAL) BY MOUTH DAILY., Disp: 90 tablet, Rfl: 4   KLOR-CON M20 20 MEQ tablet, TAKE 1 TABLET BY MOUTH EVERY DAY, Disp: 90 tablet, Rfl: 3   metFORMIN (GLUCOPHAGE-XR) 500 MG 24 hr tablet, TAKE 2 TABLETS BY MOUTH  DAILY WITH BREAKFAST, Disp: 180 tablet, Rfl: 3   naproxen (NAPROSYN) 500 MG tablet, Take 1 tablet (500 mg total) by mouth 2 (two) times daily as needed., Disp: 30 tablet, Rfl: 1   pravastatin (PRAVACHOL) 80 MG tablet, TAKE 1 TABLET BY MOUTH DAILY, Disp: 90 tablet, Rfl: 4   saxagliptin HCl (ONGLYZA) 5 MG TABS tablet, Take 1 tablet (5 mg total) by mouth daily., Disp: 30 tablet, Rfl: 3   Medications ordered in this encounter:  Meds ordered this encounter  Medications   molnupiravir EUA (LAGEVRIO) 200 mg CAPS capsule    Sig: Take 4 capsules (800 mg total) by mouth 2 (two) times daily for 5 days.    Dispense:  40 capsule    Refill:  0    Order Specific Question:   Supervising Provider    Answer:   Sabra Heck, [place]     *If you need refills on other medications  prior to your next appointment, please contact your pharmacy*  Follow-Up: Call back or seek an in-person evaluation if the symptoms worsen or if the condition fails to improve as anticipated.  Other Instructions COVID-19: Quarantine and Isolation Quarantine If you were exposed Quarantine and stay away from others when you have been in close contact with someone who has COVID-19. Isolate If you are sick or test positive Isolate when you are sick or when you have COVID-19, even if you don't have symptoms. When to stay home Calculating quarantine The date of your exposure is considered day 0. Day 1 is the first full day after your last contact with a person who has had COVID-19. Stay home and away from other people for at least 5 days. Learn why CDC updated guidance for the general public. IF YOU were exposed to COVID-19 and are NOT  up to dateIF YOU were exposed to COVID-19 and are NOT on COVID-19 vaccinations Quarantine for at least 5 days Stay home Stay home and quarantine for at least 5 full days. Wear a well-fitting mask if you must be around others in your home. Do not travel. Get tested Even if you don't develop symptoms, get tested at least 5 days after you last had close contact with someone with COVID-19. After quarantine Watch for symptoms Watch for symptoms until 10 days after you last had close  contact with someone with COVID-19. Avoid travel It is best to avoid travel until a full 10 days after you last had close contact with someone with COVID-19. If you develop symptoms Isolate immediately and get tested. Continue to stay home until you know the results. Wear a well-fitting mask around others. Take precautions until day 10 Wear a well-fitting mask Wear a well-fitting mask for 10 full days any time you are around others inside your home or in public. Do not go to places where you are unable to wear a well-fitting mask. If you must travel during days 6-10, take  precautions. Avoid being around people who are more likely to get very sick from COVID-19. IF YOU were exposed to COVID-19 and are  up to dateIF YOU were exposed to COVID-19 and are on COVID-19 vaccinations No quarantine You do not need to stay home unless you develop symptoms. Get tested Even if you don't develop symptoms, get tested at least 5 days after you last had close contact with someone with COVID-19. Watch for symptoms Watch for symptoms until 10 days after you last had close contact with someone with COVID-19. If you develop symptoms Isolate immediately and get tested. Continue to stay home until you know the results. Wear a well-fitting mask around others. Take precautions until day 10 Wear a well-fitting mask Wear a well-fitting mask for 10 full days any time you are around others inside your home or in public. Do not go to places where you are unable to wear a well-fitting mask. Take precautions if traveling Avoid being around people who are more likely to get very sick from COVID-19. IF YOU were exposed to COVID-19 and had confirmed COVID-19 within the past 90 days (you tested positive using a viral test) No quarantine You do not need to stay home unless you develop symptoms. Watch for symptoms Watch for symptoms until 10 days after you last had close contact with someone with COVID-19. If you develop symptoms Isolate immediately and get tested. Continue to stay home until you know the results. Wear a well-fitting mask around others. Take precautions until day 10 Wear a well-fitting mask Wear a well-fitting mask for 10 full days any time you are around others inside your home or in public. Do not go to places where you are unable to wear a well-fitting mask. Take precautions if traveling Avoid being around people who are more likely to get very sick from COVID-19. Calculating isolation Day 0 is your first day of symptoms or a positive viral test. Day 1 is the first full  day after your symptoms developed or your test specimen was collected. If you have COVID-19 or have symptoms, isolate for at least 5 days. IF YOU tested positive for COVID-19 or have symptoms, regardless of vaccination status Stay home for at least 5 days Stay home for 5 days and isolate from others in your home. Wear a well-fitting mask if you must be around others in your home. Do not travel. Ending isolation if you had symptoms End isolation after 5 full days if you are fever-free for 24 hours (without the use of fever-reducing medication) and your symptoms are improving. Ending isolation if you did NOT have symptoms End isolation after at least 5 full days after your positive test. If you got very sick from COVID-19 or have a weakened immune system You should isolate for at least 10 days. Consult your doctor before ending isolation. Take precautions until day 10 Wear a well-fitting  mask Wear a well-fitting mask for 10 full days any time you are around others inside your home or in public. Do not go to places where you are unable to wear a well-fitting mask. Do not travel Do not travel until a full 10 days after your symptoms started or the date your positive test was taken if you had no symptoms. Avoid being around people who are more likely to get very sick from COVID-19. Definitions Exposure Contact with someone infected with SARS-CoV-2, the virus that causes COVID-19, in a way that increases the likelihood of getting infected with the virus. Close contact A close contact is someone who was less than 6 feet away from an infected person (laboratory-confirmed or a clinical diagnosis) for a cumulative total of 15 minutes or more over a 24-hour period. For example, three individual 5-minute exposures for a total of 15 minutes. People who are exposed to someone with COVID-19 after they completed at least 5 days of isolation are not considered close contacts. Julio Sicks is a  strategy used to prevent transmission of COVID-19 by keeping people who have been in close contact with someone with COVID-19 apart from others. Who does not need to quarantine? If you had close contact with someone with COVID-19 and you are in one of the following groups, you do not need to quarantine. You are up to date with your COVID-19 vaccines. You had confirmed COVID-19 within the last 90 days (meaning you tested positive using a viral test). If you are up to date with COVID-19 vaccines, you should wear a well-fitting mask around others for 10 days from the date of your last close contact with someone with COVID-19 (the date of last close contact is considered day 0). Get tested at least 5 days after you last had close contact with someone with COVID-19. If you test positive or develop COVID-19 symptoms, isolate from other people and follow recommendations in the Isolation section below. If you tested positive for COVID-19 with a viral test within the previous 90 days and subsequently recovered and remain without COVID-19 symptoms, you do not need to quarantine or get tested after close contact. You should wear a well-fitting mask around others for 10 days from the date of your last close contact with someone with COVID-19 (the date of last close contact is considered day 0). If you have COVID-19 symptoms, get tested and isolate from other people and follow recommendations in the Isolation section below. Who should quarantine? If you come into close contact with someone with COVID-19, you should quarantine if you are not up to date on COVID-19 vaccines. This includes people who are not vaccinated. What to do for quarantine Stay home and away from other people for at least 5 days (day 0 through day 5) after your last contact with a person who has COVID-19. The date of your exposure is considered day 0. Wear a well-fitting mask when around others at home, if possible. For 10 days after your last close  contact with someone with COVID-19, watch for fever (100.64F or greater), cough, shortness of breath, or other COVID-19 symptoms. If you develop symptoms, get tested immediately and isolate until you receive your test results. If you test positive, follow isolation recommendations. If you do not develop symptoms, get tested at least 5 days after you last had close contact with someone with COVID-19. If you test negative, you can leave your home, but continue to wear a well-fitting mask when around others at home  and in public until 10 days after your last close contact with someone with COVID-19. If you test positive, you should isolate for at least 5 days from the date of your positive test (if you do not have symptoms). If you do develop COVID-19 symptoms, isolate for at least 5 days from the date your symptoms began (the date the symptoms started is day 0). Follow recommendations in the isolation section below. If you are unable to get a test 5 days after last close contact with someone with COVID-19, you can leave your home after day 5 if you have been without COVID-19 symptoms throughout the 5-day period. Wear a well-fitting mask for 10 days after your date of last close contact when around others at home and in public. Avoid people who are have weakened immune systems or are more likely to get very sick from COVID-19, and nursing homes and other high-risk settings, until after at least 10 days. If possible, stay away from people you live with, especially people who are at higher risk for getting very sick from COVID-19, as well as others outside your home throughout the full 10 days after your last close contact with someone with COVID-19. If you are unable to quarantine, you should wear a well-fitting mask for 10 days when around others at home and in public. If you are unable to wear a mask when around others, you should continue to quarantine for 10 days. Avoid people who have weakened immune  systems or are more likely to get very sick from COVID-19, and nursing homes and other high-risk settings, until after at least 10 days. See additional information about travel. Do not go to places where you are unable to wear a mask, such as restaurants and some gyms, and avoid eating around others at home and at work until after 10 days after your last close contact with someone with COVID-19. After quarantine Watch for symptoms until 10 days after your last close contact with someone with COVID-19. If you have symptoms, isolate immediately and get tested. Quarantine in high-risk congregate settings In certain congregate settings that have high risk of secondary transmission (such as Systems analyst and detention facilities, homeless shelters, or cruise ships), CDC recommends a 10-day quarantine for residents, regardless of vaccination and booster status. During periods of critical staffing shortages, facilities may consider shortening the quarantine period for staff to ensure continuity of operations. Decisions to shorten quarantine in these settings should be made in consultation with state, local, tribal, or territorial health departments and should take into consideration the context and characteristics of the facility. CDC's setting-specific guidance provides additional recommendations for these settings. Isolation Isolation is used to separate people with confirmed or suspected COVID-19 from those without COVID-19. People who are in isolation should stay home until it's safe for them to be around others. At home, anyone sick or infected should separate from others, or wear a well-fitting mask when they need to be around others. People in isolation should stay in a specific "sick room" or area and use a separate bathroom if available. Everyone who has presumed or confirmed COVID-19 should stay home and isolate from other people for at least 5 full days (day 0 is the first day of symptoms or the date of  the day of the positive viral test for asymptomatic persons). They should wear a mask when around others at home and in public for an additional 5 days. People who are confirmed to have COVID-19 or are showing symptoms  of COVID-19 need to isolate regardless of their vaccination status. This includes: People who have a positive viral test for COVID-19, regardless of whether or not they have symptoms. People with symptoms of COVID-19, including people who are awaiting test results or have not been tested. People with symptoms should isolate even if they do not know if they have been in close contact with someone with COVID-19. What to do for isolation Monitor your symptoms. If you have an emergency warning sign (including trouble breathing), seek emergency medical care immediately. Stay in a separate room from other household members, if possible. Use a separate bathroom, if possible. Take steps to improve ventilation at home, if possible. Avoid contact with other members of the household and pets. Don't share personal household items, like cups, towels, and utensils. Wear a well-fitting mask when you need to be around other people. Learn more about what to do if you are sick and how to notify your contacts. Ending isolation for people who had COVID-19 and had symptoms If you had COVID-19 and had symptoms, isolate for at least 5 days. To calculate your 5-day isolation period, day 0 is your first day of symptoms. Day 1 is the first full day after your symptoms developed. You can leave isolation after 5 full days. You can end isolation after 5 full days if you are fever-free for 24 hours without the use of fever-reducing medication and your other symptoms have improved (Loss of taste and smell may persist for weeks or months after recovery and need not delay the end of isolation). You should continue to wear a well-fitting mask around others at home and in public for 5 additional days (day 6 through day  10) after the end of your 5-day isolation period. If you are unable to wear a mask when around others, you should continue to isolate for a full 10 days. Avoid people who have weakened immune systems or are more likely to get very sick from COVID-19, and nursing homes and other high-risk settings, until after at least 10 days. If you continue to have fever or your other symptoms have not improved after 5 days of isolation, you should wait to end your isolation until you are fever-free for 24 hours without the use of fever-reducing medication and your other symptoms have improved. Continue to wear a well-fitting mask through day 10. Contact your healthcare provider if you have questions. See additional information about travel. Do not go to places where you are unable to wear a mask, such as restaurants and some gyms, and avoid eating around others at home and at work until a full 10 days after your first day of symptoms. If an individual has access to a test and wants to test, the best approach is to use an antigen test1 towards the end of the 5-day isolation period. Collect the test sample only if you are fever-free for 24 hours without the use of fever-reducing medication and your other symptoms have improved (loss of taste and smell may persist for weeks or months after recovery and need not delay the end of isolation). If your test result is positive, you should continue to isolate until day 10. If your test result is negative, you can end isolation, but continue to wear a well-fitting mask around others at home and in public until day 10. Follow additional recommendations for masking and avoiding travel as described above. 1As noted in the labeling for authorized over-the counter antigen tests: Negative results should be  treated as presumptive. Negative results do not rule out SARS-CoV-2 infection and should not be used as the sole basis for treatment or patient management decisions, including infection  control decisions. To improve results, antigen tests should be used twice over a three-day period with at least 24 hours and no more than 48 hours between tests. Note that these recommendations on ending isolation do not apply to people who are moderately ill or very sick from COVID-19 or have weakened immune systems. See section below for recommendations for when to end isolation for these groups. Ending isolation for people who tested positive for COVID-19 but had no symptoms If you test positive for COVID-19 and never develop symptoms, isolate for at least 5 days. Day 0 is the day of your positive viral test (based on the date you were tested) and day 1 is the first full day after the specimen was collected for your positive test. You can leave isolation after 5 full days. If you continue to have no symptoms, you can end isolation after at least 5 days. You should continue to wear a well-fitting mask around others at home and in public until day 10 (day 6 through day 10). If you are unable to wear a mask when around others, you should continue to isolate for 10 days. Avoid people who have weakened immune systems or are more likely to get very sick from COVID-19, and nursing homes and other high-risk settings, until after at least 10 days. If you develop symptoms after testing positive, your 5-day isolation period should start over. Day 0 is your first day of symptoms. Follow the recommendations above for ending isolation for people who had COVID-19 and had symptoms. See additional information about travel. Do not go to places where you are unable to wear a mask, such as restaurants and some gyms, and avoid eating around others at home and at work until 10 days after the day of your positive test. If an individual has access to a test and wants to test, the best approach is to use an antigen test1 towards the end of the 5-day isolation period. If your test result is positive, you should continue to  isolate until day 10. If your test result is positive, you can also choose to test daily and if your test result is negative, you can end isolation, but continue to wear a well-fitting mask around others at home and in public until day 10. Follow additional recommendations for masking and avoiding travel as described above. 1As noted in the labeling for authorized over-the counter antigen tests: Negative results should be treated as presumptive. Negative results do not rule out SARS-CoV-2 infection and should not be used as the sole basis for treatment or patient management decisions, including infection control decisions. To improve results, antigen tests should be used twice over a three-day period with at least 24 hours and no more than 48 hours between tests. Ending isolation for people who were moderately or very sick from COVID-19 or have a weakened immune system People who are moderately ill from COVID-19 (experiencing symptoms that affect the lungs like shortness of breath or difficulty breathing) should isolate for 10 days and follow all other isolation precautions. To calculate your 10-day isolation period, day 0 is your first day of symptoms. Day 1 is the first full day after your symptoms developed. If you are unsure if your symptoms are moderate, talk to a healthcare provider for further guidance. People who are very sick from  COVID-19 (this means people who were hospitalized or required intensive care or ventilation support) and people who have weakened immune systems might need to isolate at home longer. They may also require testing with a viral test to determine when they can be around others. CDC recommends an isolation period of at least 10 and up to 20 days for people who were very sick from COVID-19 and for people with weakened immune systems. Consult with your healthcare provider about when you can resume being around other people. If you are unsure if your symptoms are severe or if you  have a weakened immune system, talk to a healthcare provider for further guidance. People who have a weakened immune system should talk to their healthcare provider about the potential for reduced immune responses to COVID-19 vaccines and the need to continue to follow current prevention measures (including wearing a well-fitting mask and avoiding crowds and poorly ventilated indoor spaces) to protect themselves against COVID-19 until advised otherwise by their healthcare provider. Close contacts of immunocompromised people--including household members--should also be encouraged to receive all recommended COVID-19 vaccine doses to help protect these people. Isolation in high-risk congregate settings In certain high-risk congregate settings that have high risk of secondary transmission and where it is not feasible to cohort people (such as Systems analyst and detention facilities, homeless shelters, and cruise ships), CDC recommends a 10-day isolation period for residents. During periods of critical staffing shortages, facilities may consider shortening the isolation period for staff to ensure continuity of operations. Decisions to shorten isolation in these settings should be made in consultation with state, local, tribal, or territorial health departments and should take into consideration the context and characteristics of the facility. CDC's setting-specific guidance provides additional recommendations for these settings. This CDC guidance is meant to supplement--not replace--any federal, state, local, territorial, or tribal health and safety laws, rules, and regulations. Recommendations for specific settings These recommendations do not apply to healthcare professionals. For guidance specific to these settings, see Healthcare professionals: Interim Guidance for Optician, dispensing with SARS-CoV-2 Infection or Exposure to SARS-CoV-2 Patients, residents, and visitors to healthcare settings: Interim  Infection Prevention and Control Recommendations for Healthcare Personnel During the [place] 2019 (COVID-19) Pandemic Additional setting-specific guidance and recommendations are available. These recommendations on quarantine and isolation do apply to [place] settings. Additional guidance is available here: Overview of COVID-19 Quarantine for K-12 Schools Travelers: Travel information and recommendations Congregate facilities and other settings: [employer] for community, work, and school settings Ongoing COVID-19 exposure FAQs I live with someone with COVID-19, but I cannot be separated from them. How do we manage quarantine in this situation? It is very important for people with COVID-19 to remain apart from other people, if possible, even if they are living together. If separation of the person with COVID-19 from others that they live with is not possible, the other people that they live with will have ongoing exposure, meaning they will be repeatedly exposed until that person is no longer able to spread the virus to other people. In this situation, there are precautions you can take to limit the spread of COVID-19: The person with COVID-19 and everyone they live with should wear a well-fitting mask inside the home. If possible, one person should care for the person with COVID-19 to limit the number of people who are in close contact with the infected person. Take steps to protect yourself and others to reduce transmission in the home: Quarantine if you are not up to  date with your COVID-19 vaccines. Isolate if you are sick or tested positive for COVID-19, even if you don't have symptoms. Learn more about the public health recommendations for testing, mask use and quarantine of close contacts, like yourself, who have ongoing exposure. These recommendations differ depending on your vaccination status. What should I do if I have ongoing exposure to COVID-19 from someone I live  with? Recommendations for this situation depend on your vaccination status: If you are not up to date on COVID-19 vaccines and have ongoing exposure to COVID-19, you should: Begin quarantine immediately and continue to quarantine throughout the isolation period of the person with COVID-19. Continue to quarantine for an additional 5 days starting the day after the end of isolation for the person with COVID-19. Get tested at least 5 days after the end of isolation of the infected person that lives with them. If you test negative, you can leave the home but should continue to wear a well-fitting mask when around others at home and in public until 10 days after the end of isolation for the person with COVID-19. Isolate immediately if you develop symptoms of COVID-19 or test positive. If you are up to date with COVID-19 vaccines and have ongoing exposure to COVID-19, you should: Get tested at least 5 days after your first exposure. A person with COVID-19 is considered infectious starting 2 days before they develop symptoms, or 2 days before the date of their positive test if they do not have symptoms. Get tested again at least 5 days after the end of isolation for the person with COVID-19. Wear a well-fitting mask when you are around the person with COVID-19, and do this throughout their isolation period. Wear a well-fitting mask around others for 10 days after the infected person's isolation period ends. Isolate immediately if you develop symptoms of COVID-19 or test positive. What should I do if multiple people I live with test positive for COVID-19 at different times? Recommendations for this situation depend on your vaccination status: If you are not up to date with your COVID-19 vaccines, you should: Quarantine throughout the isolation period of any infected person that you live with. Continue to quarantine until 5 days after the end of isolation date for the most recently infected person that  lives with you. For example, if the last day of isolation of the person most recently infected with COVID-19 was June 30, the new 5-day quarantine period starts on July 1. Get tested at least 5 days after the end of isolation for the most recently infected person that lives with you. Wear a well-fitting mask when you are around any person with COVID-19 while that person is in isolation. Wear a well-fitting mask when you are around other people until 10 days after your last close contact. Isolate immediately if you develop symptoms of COVID-19 or test positive. If you are up to date with your COVID-19 vaccines, you should: Get tested at least 5 days after your first exposure. A person with COVID-19 is considered infectious starting 2 days before they developed symptoms, or 2 days before the date of their positive test if they do not have symptoms. Get tested again at least 5 days after the end of isolation for the most recently infected person that lives with you. Wear a well-fitting mask when you are around any person with COVID-19 while that person is in isolation. Wear a well-fitting mask around others for 10 days after the end of isolation for the most  recently infected person that lives with you. For example, if the last day of isolation for the person most recently infected with COVID-19 was June 30, the new 10-day period to wear a well-fitting mask indoors in public starts on July 1. Isolate immediately if you develop symptoms of COVID-19 or test positive. I had COVID-19 and completed isolation. Do I have to quarantine or get tested if someone I live with gets COVID-19 shortly after I completed isolation? No. If you recently completed isolation and someone that lives with you tests positive for the virus that causes COVID-19 shortly after the end of your isolation period, you do not have to quarantine or get tested as long as you do not develop new symptoms. Once all of the people that live  together have completed isolation or quarantine, refer to the guidance below for new exposures to COVID-19. If you had COVID-19 in the previous 90 days and then came into close contact with someone with COVID-19, you do not have to quarantine or get tested if you do not have symptoms. But you should: Wear a well-fitting mask indoors in public for 10 days after your last close contact. Monitor for COVID-19 symptoms for 10 days from the date of your last close contact. Isolate immediately and get tested if symptoms develop. If more than 90 days have passed since your recovery from infection, follow CDC's recommendations for close contacts. These recommendations will differ depending on your vaccination status. 10/18/2020 Content source: [facility] for Immunization and Respiratory Diseases (NCIRD), Division of Viral Diseases This information is not intended to replace advice given to you by your health care provider. Make sure you discuss any questions you have with your health care provider. Document Revised: 02/21/2021 Document Reviewed: 02/21/2021 Elsevier Patient Education  2022 [employer].    If you have been instructed to have an in-person evaluation today at a local Urgent Care facility, please use the link below. It will take you to a list of all of our available [facility], including address, phone number and hours of operation. Please do not delay care.  [facility]  If you or a family member do not have a primary care provider, use the link below to schedule a visit and establish care. When you choose a [facility] primary care physician or advanced practice provider, you gain a long-term partner in health. Find a Primary Care Provider  Learn more about 's in-office and virtual care options: [facility]

## 2022-01-29 ENCOUNTER — Telehealth: Payer: 59 | Admitting: Family Medicine

## 2022-01-30 ENCOUNTER — Encounter: Payer: Self-pay | Admitting: Physician Assistant

## 2022-02-08 ENCOUNTER — Ambulatory Visit: Payer: 59 | Admitting: Family Medicine

## 2022-02-14 ENCOUNTER — Ambulatory Visit: Payer: 59 | Admitting: Physician Assistant

## 2022-02-14 ENCOUNTER — Encounter: Payer: Self-pay | Admitting: Physician Assistant

## 2022-02-14 VITALS — BP 122/93 | HR 68 | Ht 71.0 in | Wt 220.8 lb

## 2022-02-14 DIAGNOSIS — E1140 Type 2 diabetes mellitus with diabetic neuropathy, unspecified: Secondary | ICD-10-CM

## 2022-02-14 DIAGNOSIS — I10 Essential (primary) hypertension: Secondary | ICD-10-CM | POA: Diagnosis not present

## 2022-02-14 NOTE — Progress Notes (Signed)
I,Sha'taria Tyson,acting as a Education administrator for [employer], PA-C.,have documented all relevant documentation on the behalf of Douglas Kirschner, PA-C,as directed by  Douglas Kirschner, PA-C while in the presence of Douglas Kirschner, PA-C.  Established patient visit   Patient: Douglas Murray   DOB: 1971-04-09   51 y.o. Male  MRN: [id number] Visit Date: 02/14/2022  Today's healthcare provider: [name], PA-C   Cc. DMII  Subjective    HPI  Diabetes Mellitus Type II, Follow-up  Lab Results  Component Value Date   HGBA1C 8.9 (A) 09/18/2021   HGBA1C 8.4 (A) 05/14/2021   HGBA1C 7.8 (A) 12/08/2020   Wt Readings from Last 3 Encounters:  02/14/22 220 lb 12.8 oz (100.2 kg)  09/18/21 227 lb (103 kg)  07/19/21 225 lb (102.1 kg)   Last seen for diabetes 6 months ago.  Management since then includes continue metformin and jardiance, also add saxagliptin HCl (ONGLYZA) 5 MG TABS. Reports being out of the saxaglipitin for several months He reports fair compliance with treatment. He is not having side effects.  Symptoms: Yes fatigue No foot ulcerations  No appetite changes No nausea  Yes paresthesia of the feet  No polydipsia  No polyuria No visual disturbances   No vomiting     Home blood sugar records: fasting range: 170s  Episodes of hypoglycemia? No    Current insulin regiment: none Most Recent Eye Exam: Need to update Current exercise: walking Current diet habits: general diet  Pertinent Labs: Lab Results  Component Value Date   CHOL 155 12/08/2020   HDL 26 (L) 12/08/2020   LDLCALC 91 12/08/2020   TRIG 222 (H) 12/08/2020   CHOLHDL 6.0 (H) 12/08/2020   Lab Results  Component Value Date   NA 140 12/08/2020   K 3.4 (L) 12/08/2020   CREATININE 1.19 12/08/2020   EGFR 75 12/08/2020   MICROALBUR 20 12/08/2020   LABMICR 7.0 09/18/2021      ---------------------------------------------------------------------------------------------------   Medications: Outpatient Medications Prior to Visit  Medication Sig   atenolol-chlorthalidone (TENORETIC) 50-25 MG tablet TAKE 1 TABLET BY MOUTH  DAILY   JARDIANCE 25 MG TABS tablet TAKE 1 TABLET (25 MG TOTAL) BY MOUTH DAILY.   KLOR-CON M20 20 MEQ tablet TAKE 1 TABLET BY MOUTH EVERY DAY   metFORMIN (GLUCOPHAGE-XR) 500 MG 24 hr tablet TAKE 2 TABLETS BY MOUTH  DAILY WITH BREAKFAST   naproxen (NAPROSYN) 500 MG tablet Take 1 tablet (500 mg total) by mouth 2 (two) times daily as needed.   pravastatin (PRAVACHOL) 80 MG tablet TAKE 1 TABLET BY MOUTH DAILY   fluticasone (FLONASE) 50 MCG/ACT nasal spray Place 2 sprays into both nostrils daily. (Patient not taking: Reported on 09/18/2021)   saxagliptin HCl (ONGLYZA) 5 MG TABS tablet Take 1 tablet (5 mg total) by mouth daily. (Patient not taking: Reported on 02/14/2022)   No facility-administered medications prior to visit.    Review of Systems  Constitutional:  Negative for fatigue and fever.  Respiratory:  Negative for cough and shortness of breath.   Cardiovascular:  Negative for chest pain, palpitations and leg swelling.  Neurological:  Negative for dizziness and headaches.      Objective    Blood pressure (!) 122/93, pulse 68, height '5\' 11"'  (1.803 m), weight 220 lb 12.8 oz (100.2 kg), SpO2 98 %.   Physical Exam Constitutional:      General: He is awake.     Appearance: He is well-developed.  HENT:     Head: Normocephalic.  Eyes:     Conjunctiva/sclera: Conjunctivae normal.  Cardiovascular:     Rate and Rhythm: Normal rate and regular rhythm.     Heart sounds: Normal heart sounds.  Pulmonary:     Effort: Pulmonary effort is normal.     Breath sounds: Normal breath sounds.  Skin:    General: Skin is warm.  Neurological:     Mental Status: He is alert and oriented to person, place, and time.  Psychiatric:        Attention and  Perception: Attention normal.        Mood and Affect: Mood normal.        Speech: Speech normal.        Behavior: Behavior is cooperative.      No results found for any visits on 02/14/22.  Assessment & Plan     Problem List Items Addressed This Visit       Cardiovascular and Mediastinum   Essential (primary) hypertension    Slightly elevated in office, continue current medications Will monitor next follow up  Ordered cmp        Endocrine   Diabetes mellitus, type 2 ([place])    Uncontrolled Previously on metformin 500 xr, jardiance 25 mg, and saxagliptin 5 mg. Pt has not had the saxagliptin in several months. Will check a1c before adding back sazagliptin at current dose Uacr, foot exam utd  Encouraged pt to follow up with optho On statin Not on ace-I but uacr wnl F/u 3 mo      Relevant Orders   Comprehensive Metabolic Panel (CMET)   HgB A1c     Return in about 3 months (around 05/17/2022) for DMII. Will contact with lab results     I, Douglas Kirschner, PA-C have reviewed all documentation for this visit. The documentation on  02/14/2022 for the exam, diagnosis, procedures, and orders are all accurate and complete.  Douglas Kirschner, PA-C [facility] [address] [place], [place], [address] Office: [phone] Fax: [place]

## 2022-02-14 NOTE — Assessment & Plan Note (Addendum)
Slightly elevated in office, continue current medications Will monitor next follow up  Ordered cmp

## 2022-02-14 NOTE — Assessment & Plan Note (Signed)
Uncontrolled Previously on metformin 500 xr, jardiance 25 mg, and saxagliptin 5 mg. Pt has not had the saxagliptin in several months. Will check a1c before adding back sazagliptin at current dose Uacr, foot exam utd  Encouraged pt to follow up with optho On statin Not on ace-I but uacr wnl F/u 3 mo

## 2022-02-15 ENCOUNTER — Other Ambulatory Visit: Payer: Self-pay | Admitting: Physician Assistant

## 2022-02-15 ENCOUNTER — Other Ambulatory Visit: Payer: Self-pay

## 2022-02-15 DIAGNOSIS — R7989 Other specified abnormal findings of blood chemistry: Secondary | ICD-10-CM

## 2022-02-15 DIAGNOSIS — E1140 Type 2 diabetes mellitus with diabetic neuropathy, unspecified: Secondary | ICD-10-CM

## 2022-02-15 LAB — COMPREHENSIVE METABOLIC PANEL
ALT: 31 IU/L (ref 0–44)
AST: 18 IU/L (ref 0–40)
Albumin/Globulin Ratio: 2.1 (ref 1.2–2.2)
Albumin: 4.8 g/dL (ref 4.1–5.1)
Alkaline Phosphatase: 78 IU/L (ref 44–121)
BUN/Creatinine Ratio: 8 — ABNORMAL LOW (ref 9–20)
BUN: 10 mg/dL (ref 6–24)
Bilirubin Total: 0.4 mg/dL (ref 0.0–1.2)
CO2: 27 mmol/L (ref 20–29)
Calcium: 10.1 mg/dL (ref 8.7–10.2)
Chloride: 97 mmol/L (ref 96–106)
Creatinine, Ser: 1.33 mg/dL — ABNORMAL HIGH (ref 0.76–1.27)
Globulin, Total: 2.3 g/dL (ref 1.5–4.5)
Glucose: 176 mg/dL — ABNORMAL HIGH (ref 70–99)
Potassium: 3.4 mmol/L — ABNORMAL LOW (ref 3.5–5.2)
Sodium: 142 mmol/L (ref 134–144)
Total Protein: 7.1 g/dL (ref 6.0–8.5)
eGFR: 66 mL/min/{1.73_m2} (ref >59)

## 2022-02-15 LAB — HEMOGLOBIN A1C
Est. average glucose Bld gHb Est-mCnc: 203 mg/dL
Hgb A1c MFr Bld: 8.7 % — ABNORMAL HIGH (ref 4.8–5.6)

## 2022-02-15 MED ORDER — SAXAGLIPTIN HCL 5 MG PO TABS: 5.0000 mg | ORAL_TABLET | Freq: Every day | ORAL | 1 refills | Status: DC

## 2022-05-17 ENCOUNTER — Encounter: Payer: Self-pay | Admitting: Family Medicine

## 2022-05-17 ENCOUNTER — Ambulatory Visit: Payer: 59 | Admitting: Family Medicine

## 2022-05-17 VITALS — BP 116/79 | HR 63 | Temp 98.6°F | Resp 16 | Wt 224.0 lb

## 2022-05-17 DIAGNOSIS — Z23 Encounter for immunization: Secondary | ICD-10-CM

## 2022-05-17 DIAGNOSIS — E1140 Type 2 diabetes mellitus with diabetic neuropathy, unspecified: Secondary | ICD-10-CM | POA: Diagnosis not present

## 2022-05-17 DIAGNOSIS — I10 Essential (primary) hypertension: Secondary | ICD-10-CM

## 2022-05-17 LAB — POCT GLYCOSYLATED HEMOGLOBIN (HGB A1C)
Est. average glucose Bld gHb Est-mCnc: 214
Hemoglobin A1C: 9.1 % — AB (ref 4.0–5.6)

## 2022-05-17 NOTE — Progress Notes (Signed)
I,Roshena L Chambers,acting as a scribe for Lelon Huh, MD.,have documented all relevant documentation on the behalf of Lelon Huh, MD,as directed by  Lelon Huh, MD while in the presence of Lelon Huh, MD.   Established patient visit   Patient: Douglas Murray   DOB: Apr 20, 1971   51 y.o. Male  MRN: [id number] Visit Date: 05/17/2022  Today's healthcare provider: Lelon Huh, MD   Chief Complaint  Patient presents with   Hypertension   Diabetes   Subjective    HPI  Diabetes Mellitus Type II, Follow-up  Lab Results  Component Value Date   HGBA1C 9.1 (A) 05/17/2022   HGBA1C 8.7 (H) 02/14/2022   HGBA1C 8.9 (A) 09/18/2021   Wt Readings from Last 3 Encounters:  05/17/22 224 lb (101.6 kg)  02/14/22 220 lb 12.8 oz (100.2 kg)  09/18/21 227 lb (103 kg)   Last seen for diabetes 3 months ago.  Management since then includes continue same medication. He reports fair compliance with treatment. He was prescribed saxagliptin in February which he took for about a month, but states he wasn't sure if he was supposed to keep taking it along with the other diabetes medications. States he picked up prescription at pharmacy, but hasn't taken in in several months, even prior to labs done at end of July.  He is not having side effects.  Symptoms: No fatigue No foot ulcerations  No appetite changes No nausea  No paresthesia of the feet  No polydipsia  No polyuria No visual disturbances   No vomiting     Home blood sugar records: fasting range: 190's  Episodes of hypoglycemia? No    Current insulin regiment: none Most Recent Eye Exam: not UTD Current exercise: none Current diet habits: in general, an "unhealthy" diet  Pertinent Labs: Lab Results  Component Value Date   CHOL 155 12/08/2020   HDL 26 (L) 12/08/2020   LDLCALC 91 12/08/2020   TRIG 222 (H) 12/08/2020   CHOLHDL 6.0 (H) 12/08/2020   Lab Results  Component Value Date   NA 142 02/14/2022   K 3.4  (L) 02/14/2022   CREATININE 1.33 (H) 02/14/2022   EGFR 66 02/14/2022   MICROALBUR 20 12/08/2020   LABMICR 7.0 09/18/2021     ---------------------------------------------------------------------------------------------------   Hypertension, follow-up  BP Readings from Last 3 Encounters:  05/17/22 116/79  02/14/22 (!) 122/93  09/18/21 121/88   Wt Readings from Last 3 Encounters:  05/17/22 224 lb (101.6 kg)  02/14/22 220 lb 12.8 oz (100.2 kg)  09/18/21 227 lb (103 kg)     He was last seen for hypertension 3 months ago.  BP at that visit was 122/93. Management since that visit includes continuing same medication.  He reports good compliance with treatment. He is not having side effects.  He is following a Regular diet. He is not exercising. He does not smoke.  Use of agents associated with hypertension: none.   Outside blood pressures are 125/85. Symptoms: No chest pain No chest pressure  No palpitations No syncope  No dyspnea No orthopnea  No paroxysmal nocturnal dyspnea No lower extremity edema    ---------------------------------------------------------------------------------------------------   Medications: Outpatient Medications Prior to Visit  Medication Sig   atenolol-chlorthalidone (TENORETIC) 50-25 MG tablet TAKE 1 TABLET BY MOUTH  DAILY   fluticasone (FLONASE) 50 MCG/ACT nasal spray Place 2 sprays into both nostrils daily.   JARDIANCE 25 MG TABS tablet TAKE 1 TABLET (25 MG TOTAL) BY MOUTH DAILY.   KLOR-CON  M20 20 MEQ tablet TAKE 1 TABLET BY MOUTH EVERY DAY   metFORMIN (GLUCOPHAGE-XR) 500 MG 24 hr tablet TAKE 2 TABLETS BY MOUTH  DAILY WITH BREAKFAST   naproxen (NAPROSYN) 500 MG tablet Take 1 tablet (500 mg total) by mouth 2 (two) times daily as needed.   pravastatin (PRAVACHOL) 80 MG tablet TAKE 1 TABLET BY MOUTH DAILY   saxagliptin HCl (ONGLYZA) 5 MG TABS tablet Take 1 tablet (5 mg total) by mouth daily.   No facility-administered medications prior to  visit.    Review of Systems  Constitutional:  Negative for appetite change, chills and fever.  Respiratory:  Negative for chest tightness, shortness of breath and wheezing.   Cardiovascular:  Negative for chest pain and palpitations.  Gastrointestinal:  Negative for abdominal pain, nausea and vomiting.       Objective    BP 116/79 (BP Location: Left Arm, Patient Position: Sitting, Cuff Size: Large)   Pulse 63   Temp 98.6 F (37 C) (Oral)   Resp 16   Wt 224 lb (101.6 kg)   SpO2 98% Comment: room air  BMI 31.24 kg/m    Physical Exam  General appearance: Mildly obese male, cooperative and in no acute distress Head: Normocephalic, without obvious abnormality, atraumatic Respiratory: Respirations even and unlabored, normal respiratory rate Extremities: All extremities are intact.  Skin: Skin color, texture, turgor normal. No rashes seen  Psych: Appropriate mood and affect. Neurologic: Mental status: Alert, oriented to person, place, and time, thought content appropriate.   Results for orders placed or performed in visit on 05/17/22  POCT HgB A1C  Result Value Ref Range   Hemoglobin A1C 9.1 (A) 4.0 - 5.6 %   Est. average glucose Bld gHb Est-mCnc 214     Assessment & Plan     1. Type 2 diabetes mellitus with diabetic neuropathy, without long-term current use of insulin ([place]) He only took saxagliptin which was prescribed in February for a month or two. He states he has several bottles of this at home. He is to continue metformin and Jardiance and start taking the saxagliptin daily. Follow up to check A1c in 3 months.   2. Essential (primary) hypertension Well controlled.  Continue current medications.     Flu vaccine today.      The entirety of the information documented in the History of Present Illness, Review of Systems and Physical Exam were personally obtained by me. Portions of this information were initially documented by the CMA and reviewed by me for thoroughness  and accuracy.     Lelon Huh, MD  [facility] [phone] (phone) [phone] (fax)  [place]

## 2022-06-10 ENCOUNTER — Other Ambulatory Visit: Payer: Self-pay | Admitting: Family Medicine

## 2022-07-23 ENCOUNTER — Other Ambulatory Visit: Payer: Self-pay | Admitting: Physician Assistant

## 2022-07-23 DIAGNOSIS — E1140 Type 2 diabetes mellitus with diabetic neuropathy, unspecified: Secondary | ICD-10-CM

## 2022-07-24 NOTE — Telephone Encounter (Signed)
Alternative requested: not on patient's formulary. Routing to office.

## 2022-07-26 NOTE — Telephone Encounter (Signed)
Requested medication (s) are due for refill today:Yes  Requested medication (s) are on the active medication list:Yes  Last refill:  02/15/22  Future visit scheduled: Yes  Notes to clinic:  Pharmacy comment: Alternative Requested:NOT ON PATIENTS FORMULARY.      Requested Prescriptions  Pending Prescriptions Disp Refills   ONGLYZA 5 MG TABS tablet [Pharmacy Med Name: ONGLYZA 5 MG TABLET] 90 tablet 1    Sig: TAKE 1 TABLET (5 MG TOTAL) BY MOUTH DAILY.     Endocrinology:  Diabetes - DPP-4 Inhibitors Failed - 07/26/2022  4:21 PM      Failed - HBA1C is between 0 and 7.9 and within 180 days    Hemoglobin A1C  Date Value Ref Range Status  05/17/2022 9.1 (A) 4.0 - 5.6 % Final   Hgb A1c MFr Bld  Date Value Ref Range Status  02/14/2022 8.7 (H) 4.8 - 5.6 % Final    Comment:             Prediabetes: 5.7 - 6.4          Diabetes: >6.4          Glycemic control for adults with diabetes: <7.0          Failed - Cr in normal range and within 360 days    Creatinine, Ser  Date Value Ref Range Status  02/14/2022 1.33 (H) 0.76 - 1.27 mg/dL Final   Creatinine, POC  Date Value Ref Range Status  09/29/2018 n/a mg/dL Final         Passed - Valid encounter within last 6 months    Recent Outpatient Visits           2 months ago Type 2 diabetes mellitus with diabetic neuropathy, without long-term current use of insulin [facility])   [facility] Birdie Sons, MD   5 months ago Type 2 diabetes mellitus with diabetic neuropathy, without long-term current use of insulin ([place])   [facility] Thedore Mins, [place], PA-C   10 months ago Type 2 diabetes mellitus with diabetic neuropathy, without long-term current use of insulin ([place])   [facility] Birdie Sons, MD   1 year ago Acute otitis media with effusion of both ears   [facility] Mecum, Erin E, PA-C   1 year ago Type 2 diabetes mellitus with diabetic neuropathy, without long-term current  use of insulin [facility])   [facility] Birdie Sons, MD       Future Appointments             In 3 weeks Fisher, Kirstie Peri, MD [facility]

## 2022-07-27 MED ORDER — LINAGLIPTIN 5 MG PO TABS: 5.0000 mg | ORAL_TABLET | Freq: Every day | ORAL | 1 refills | Status: DC

## 2022-08-19 ENCOUNTER — Ambulatory Visit: Payer: 59 | Admitting: Family Medicine

## 2022-09-05 NOTE — Progress Notes (Unsigned)
Douglas Murray,acting as a scribe for Lelon Huh, MD.,have documented all relevant documentation on the behalf of Lelon Huh, MD,as directed by  Lelon Huh, MD while in the presence of Lelon Huh, MD.     Established patient visit   Patient: Douglas Murray   DOB: 02/21/1971   52 y.o. Male  MRN: [id number] Visit Date: 09/06/2022  Today's healthcare provider: Lelon Huh, MD    Subjective    HPI  Diabetes Mellitus Type II, Follow-up  Lab Results  Component Value Date   HGBA1C 9.1 (A) 05/17/2022   HGBA1C 8.7 (H) 02/14/2022   HGBA1C 8.9 (A) 09/18/2021   Wt Readings from Last 3 Encounters:  09/06/22 214 lb (97.1 kg)  05/17/22 224 lb (101.6 kg)  02/14/22 220 lb 12.8 oz (100.2 kg)   Last seen for diabetes 4 months ago.  Management since then includes instructing patient to restart Saxagliptin. He reports good compliance with treatment. He is not having side effects.  Symptoms: No fatigue No foot ulcerations  Yes appetite changes No nausea  No paresthesia of the feet  Yes polydipsia  No polyuria No visual disturbances   No vomiting     Home blood sugar records:  150-200  Episodes of hypoglycemia? Yes once or twice a week about mid day he feels somewhat shakey.  He does note that he eats before going to work and then not again until around 2 pm   Current insulin regiment: none Most Recent Eye Exam: patient is due Current exercise: none Current diet habits: nothing specific  Pertinent Labs: Lab Results  Component Value Date   CHOL 155 12/08/2020   HDL 26 (L) 12/08/2020   LDLCALC 91 12/08/2020   TRIG 222 (H) 12/08/2020   CHOLHDL 6.0 (H) 12/08/2020   Lab Results  Component Value Date   NA 142 02/14/2022   K 3.4 (L) 02/14/2022   CREATININE 1.33 (H) 02/14/2022   EGFR 66 02/14/2022   LABMICR 7.0 09/18/2021   MICRALBCREAT 12 09/18/2021      ---------------------------------------------------------------------------------------------------   Medications: Outpatient Medications Prior to Visit  Medication Sig   atenolol-chlorthalidone (TENORETIC) 50-25 MG tablet TAKE 1 TABLET BY MOUTH DAILY   fluticasone (FLONASE) 50 MCG/ACT nasal spray Place 2 sprays into both nostrils daily.   JARDIANCE 25 MG TABS tablet TAKE 1 TABLET (25 MG TOTAL) BY MOUTH DAILY.   KLOR-CON M20 20 MEQ tablet TAKE 1 TABLET BY MOUTH EVERY DAY   linagliptin (TRADJENTA) 5 MG TABS tablet Take 1 tablet (5 mg total) by mouth daily. TAKE IN PLACE OF ONGLYZA   metFORMIN (GLUCOPHAGE-XR) 500 MG 24 hr tablet TAKE 2 TABLETS BY MOUTH DAILY  WITH BREAKFAST   naproxen (NAPROSYN) 500 MG tablet Take 1 tablet (500 mg total) by mouth 2 (two) times daily as needed.   pravastatin (PRAVACHOL) 80 MG tablet TAKE 1 TABLET BY MOUTH DAILY   No facility-administered medications prior to visit.    Review of Systems  Constitutional:  Negative for appetite change, chills and fever.  Respiratory:  Negative for chest tightness, shortness of breath and wheezing.   Cardiovascular:  Negative for chest pain and palpitations.  Gastrointestinal:  Negative for abdominal pain, nausea and vomiting.    {Labs  Heme  Chem  Endocrine  Serology  Results Review (optional):23779}   Objective    BP (!) 126/93 (BP Location: Right Arm, Patient Position: Sitting, Cuff Size: Large)   Pulse 64   Temp 98.5 F (36.9 C) (Oral)   Wt  214 lb (97.1 kg)   SpO2 98%   BMI 29.85 kg/m  {Show previous vital signs (optional):23777}  Physical Exam  General appearance: Well developed, well nourished male, cooperative and in no acute distress Head: Normocephalic, without obvious abnormality, atraumatic Respiratory: Respirations even and unlabored, normal respiratory rate Extremities: All extremities are intact.  Skin: Skin color, texture, turgor normal. No rashes seen  Psych: Appropriate mood and  affect. Neurologic: Mental status: Alert, oriented to person, place, and time, thought content appropriate.   Results for orders placed or performed in visit on 09/06/22  POCT glycosylated hemoglobin (Hb A1C)  Result Value Ref Range   Hemoglobin A1C 8.7 (A) 4.0 - 5.6 %   Est. average glucose Bld gHb Est-mCnc 203     Assessment & Plan     1. Type 2 diabetes mellitus with diabetic neuropathy, without long-term current use of insulin ([place]) Tolerating linagliptan well. A1c improving but not to goal.  - Amb ref to Medical Nutrition Therapy-MNT  Follow up 4 months.   2. Class 1 obesity with serious comorbidity and body mass index (BMI) of 34.0 to 34.9 in adult, unspecified obesity type  - Amb ref to Medical Nutrition Therapy-MNT  3. Essential (primary) hypertension Expect improvement with lifestyle modification. Continue current medications.   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   - Lipid panel  5. Prostate cancer screening  - PSA Total (Reflex To Free) ([facility] only)      The entirety of the information documented in the History of Present Illness, Review of Systems and Physical Exam were personally obtained by me. Portions of this information were initially documented by the CMA and reviewed by me for thoroughness and accuracy.     Lelon Huh, MD  [place] [phone] (phone) [phone] (fax)  [place]

## 2022-09-06 ENCOUNTER — Ambulatory Visit: Payer: 59 | Admitting: Family Medicine

## 2022-09-06 VITALS — BP 126/93 | HR 64 | Temp 98.5°F | Wt 214.0 lb

## 2022-09-06 DIAGNOSIS — E1140 Type 2 diabetes mellitus with diabetic neuropathy, unspecified: Secondary | ICD-10-CM

## 2022-09-06 DIAGNOSIS — Z6834 Body mass index (BMI) 34.0-34.9, adult: Secondary | ICD-10-CM

## 2022-09-06 DIAGNOSIS — E881 Lipodystrophy, not elsewhere classified: Secondary | ICD-10-CM | POA: Diagnosis not present

## 2022-09-06 DIAGNOSIS — Z125 Encounter for screening for malignant neoplasm of prostate: Secondary | ICD-10-CM

## 2022-09-06 DIAGNOSIS — E669 Obesity, unspecified: Secondary | ICD-10-CM | POA: Diagnosis not present

## 2022-09-06 DIAGNOSIS — I10 Essential (primary) hypertension: Secondary | ICD-10-CM

## 2022-09-06 LAB — POCT GLYCOSYLATED HEMOGLOBIN (HGB A1C)
Est. average glucose Bld gHb Est-mCnc: 203
Hemoglobin A1C: 8.7 % — AB (ref 4.0–5.6)

## 2022-09-06 NOTE — Patient Instructions (Signed)
.   Please review the attached list of medications and notify my office if there are any errors.   . Please bring all of your medications to every appointment so we can make sure that our medication list is the same as yours.   

## 2022-09-07 LAB — COMPREHENSIVE METABOLIC PANEL
ALT: 42 IU/L (ref 0–44)
AST: 24 IU/L (ref 0–40)
Albumin/Globulin Ratio: 1.7 (ref 1.2–2.2)
Albumin: 4.9 g/dL (ref 4.1–5.1)
Alkaline Phosphatase: 77 IU/L (ref 44–121)
BUN/Creatinine Ratio: 8 — ABNORMAL LOW (ref 9–20)
BUN: 11 mg/dL (ref 6–24)
Bilirubin Total: 0.7 mg/dL (ref 0.0–1.2)
CO2: 27 mmol/L (ref 20–29)
Calcium: 10.2 mg/dL (ref 8.7–10.2)
Chloride: 97 mmol/L (ref 96–106)
Creatinine, Ser: 1.42 mg/dL — ABNORMAL HIGH (ref 0.76–1.27)
Globulin, Total: 2.9 g/dL (ref 1.5–4.5)
Glucose: 207 mg/dL — ABNORMAL HIGH (ref 70–99)
Potassium: 3.4 mmol/L — ABNORMAL LOW (ref 3.5–5.2)
Sodium: 143 mmol/L (ref 134–144)
Total Protein: 7.8 g/dL (ref 6.0–8.5)
eGFR: 60 mL/min/{1.73_m2} (ref >59)

## 2022-09-07 LAB — CBC
Hematocrit: 50.7 % (ref 37.5–51.0)
Hemoglobin: 17 g/dL (ref 13.0–17.7)
MCH: 28.1 pg (ref 26.6–33.0)
MCHC: 33.5 g/dL (ref 31.5–35.7)
MCV: 84 fL (ref 79–97)
Platelets: 258 10*3/uL (ref 150–450)
RBC: 6.05 x10E6/uL — ABNORMAL HIGH (ref 4.14–5.80)
RDW: 13.5 % (ref 11.6–15.4)
WBC: 9 10*3/uL (ref 3.4–10.8)

## 2022-09-07 LAB — LIPID PANEL
Chol/HDL Ratio: 6 ratio — ABNORMAL HIGH (ref 0.0–5.0)
Cholesterol, Total: 185 mg/dL (ref 100–199)
HDL: 31 mg/dL — ABNORMAL LOW (ref >39)
LDL Chol Calc (NIH): 124 mg/dL — ABNORMAL HIGH (ref 0–99)
Triglycerides: 169 mg/dL — ABNORMAL HIGH (ref 0–149)
VLDL Cholesterol Cal: 30 mg/dL (ref 5–40)

## 2022-09-07 LAB — PSA TOTAL (REFLEX TO FREE): Prostate Specific Ag, Serum: 1.4 ng/mL (ref 0.0–4.0)

## 2022-11-05 LAB — HM DIABETES EYE EXAM

## 2022-12-18 ENCOUNTER — Other Ambulatory Visit: Payer: Self-pay | Admitting: Family Medicine

## 2022-12-18 DIAGNOSIS — E876 Hypokalemia: Secondary | ICD-10-CM

## 2023-01-06 ENCOUNTER — Ambulatory Visit: Payer: 59 | Admitting: Family Medicine

## 2023-01-31 ENCOUNTER — Other Ambulatory Visit: Payer: Self-pay | Admitting: Family Medicine

## 2023-01-31 DIAGNOSIS — E1140 Type 2 diabetes mellitus with diabetic neuropathy, unspecified: Secondary | ICD-10-CM

## 2023-02-01 ENCOUNTER — Other Ambulatory Visit: Payer: Self-pay | Admitting: Family Medicine

## 2023-02-01 DIAGNOSIS — E119 Type 2 diabetes mellitus without complications: Secondary | ICD-10-CM

## 2023-02-07 ENCOUNTER — Ambulatory Visit: Payer: 59 | Admitting: Family Medicine

## 2023-02-07 ENCOUNTER — Encounter: Payer: Self-pay | Admitting: Family Medicine

## 2023-02-07 VITALS — BP 115/88 | HR 62 | Temp 97.7°F | Resp 12 | Ht 71.0 in | Wt 222.9 lb

## 2023-02-07 DIAGNOSIS — E881 Lipodystrophy, not elsewhere classified: Secondary | ICD-10-CM | POA: Diagnosis not present

## 2023-02-07 DIAGNOSIS — I10 Essential (primary) hypertension: Secondary | ICD-10-CM

## 2023-02-07 DIAGNOSIS — R002 Palpitations: Secondary | ICD-10-CM

## 2023-02-07 DIAGNOSIS — E1140 Type 2 diabetes mellitus with diabetic neuropathy, unspecified: Secondary | ICD-10-CM

## 2023-02-07 LAB — POCT GLYCOSYLATED HEMOGLOBIN (HGB A1C)
Est. average glucose Bld gHb Est-mCnc: 226
Hemoglobin A1C: 9.5 % — AB (ref 4.0–5.6)

## 2023-02-08 LAB — RENAL FUNCTION PANEL
Albumin: 4.7 g/dL (ref 4.1–5.1)
BUN/Creatinine Ratio: 9 (ref 9–20)
BUN: 12 mg/dL (ref 6–24)
CO2: 27 mmol/L (ref 20–29)
Calcium: 9.9 mg/dL (ref 8.7–10.2)
Chloride: 97 mmol/L (ref 96–106)
Creatinine, Ser: 1.32 mg/dL — ABNORMAL HIGH (ref 0.76–1.27)
Glucose: 190 mg/dL — ABNORMAL HIGH (ref 70–99)
Phosphorus: 5 mg/dL — ABNORMAL HIGH (ref 2.8–4.1)
Potassium: 3.5 mmol/L (ref 3.5–5.2)
Sodium: 142 mmol/L (ref 134–144)
eGFR: 66 mL/min/{1.73_m2} (ref >59)

## 2023-02-08 LAB — TSH: TSH: 1.44 u[IU]/mL (ref 0.450–4.500)

## 2023-02-08 LAB — MAGNESIUM: Magnesium: 2.3 mg/dL (ref 1.6–2.3)

## 2023-02-10 ENCOUNTER — Encounter: Payer: Self-pay | Admitting: Family Medicine

## 2023-02-11 NOTE — Progress Notes (Signed)
Established patient visit   Patient: Douglas Murray   DOB: December 26, 1970   52 y.o. Male  MRN: [id number] Visit Date: 02/07/2023  Today's healthcare provider: Mila Merry, MD   Chief Complaint  Patient presents with   Diabetes   Hypertension   Hyperlipidemia   Subjective    Discussed the use of AI scribe software for clinical note transcription with the patient, who gave verbal consent to proceed.  History of Present Illness   The patient, with a history of diabetes, hypertension, and hyperlipidemia, presents for a follow-up visit regarding his blood sugar control. He reports adherence to his current medication regimen, which includes Tradjenta, but there is some confusion about whether he is still taking Jardiance. The patient mentions a change in his medication due to insurance coverage issues.  He has not reported any symptoms of hyperglycemia.  In addition to his diabetes, the patient has been experiencing occasional heart palpitations, described as a "tickle." These episodes are infrequent and do not occur daily.  The patient's other chronic conditions, including hypertension and hyperlipidemia, appear to be well-managed with his current medication regimen. He has not reported any symptoms related to these conditions, such as chest pain or shortness of breath.       Medications: Outpatient Medications Prior to Visit  Medication Sig   atenolol-chlorthalidone (TENORETIC) 50-25 MG tablet TAKE 1 TABLET BY MOUTH DAILY   JARDIANCE 25 MG TABS tablet TAKE 1 TABLET (25 MG TOTAL) BY MOUTH DAILY.   KLOR-CON M20 20 MEQ tablet TAKE 1 TABLET BY MOUTH EVERY DAY   metFORMIN (GLUCOPHAGE-XR) 500 MG 24 hr tablet TAKE 2 TABLETS BY MOUTH DAILY  WITH BREAKFAST   pravastatin (PRAVACHOL) 80 MG tablet TAKE 1 TABLET BY MOUTH DAILY   TRADJENTA 5 MG TABS tablet TAKE 1 TABLET (5 MG TOTAL) BY MOUTH DAILY. TAKE IN PLACE OF ONGLYZA (Patient not taking: Reported on 02/07/2023)    [DISCONTINUED] fluticasone (FLONASE) 50 MCG/ACT nasal spray Place 2 sprays into both nostrils daily.   [DISCONTINUED] naproxen (NAPROSYN) 500 MG tablet Take 1 tablet (500 mg total) by mouth 2 (two) times daily as needed.   No facility-administered medications prior to visit.      Objective    BP 115/88 (BP Location: Left Arm, Patient Position: Sitting, Cuff Size: Large)   Pulse 62   Temp 97.7 F (36.5 C) (Temporal)   Resp 12   Ht 5\' 11"  (1.803 m)   Wt 222 lb 14.4 oz (101.1 kg)   SpO2 98%   BMI 31.09 kg/m     Physical Exam   General appearance: Obese male, cooperative and in no acute distress Head: Normocephalic, without obvious abnormality, atraumatic Respiratory: Respirations even and unlabored, normal respiratory rate Extremities: All extremities are intact.  Skin: Skin color, texture, turgor normal. No rashes seen  Psych: Appropriate mood and affect. Neurologic: Mental status: Alert, oriented to person, place, and time, thought content appropriate.   Lab Results  Component Value Date   HGBA1C 9.5 (A) 02/07/2023    Assessment & Plan     Assessment and Plan    Type 2 Diabetes Mellitus: A1c elevated to 9.5, indicating poor glycemic control. Unclear medication regimen due to insurance issues and potential non-adherence. -Clarify current medication regimen with patient. -Consider medication adjustment once current regimen is confirmed due to elevated A1c.  Palpitations: Occasional palpitations reported by the patient. No associated chest pain or shortness of breath. -Order thyroid and magnesium levels to rule out potential  causes of palpitations.  Hypertension and Hyperlipidemia: Well controlled on current regimen. -Continue current antihypertensive and cholesterol medications.  Dietary Management: Unsuccessful contact with dietician previously. -Attempt to reestablish contact with dietician for dietary management of diabetes.          Mila Merry, MD  [facility] [phone] (phone) [phone] (fax)  [facility]

## 2023-02-12 ENCOUNTER — Other Ambulatory Visit: Payer: Self-pay | Admitting: Family Medicine

## 2023-02-12 DIAGNOSIS — E1140 Type 2 diabetes mellitus with diabetic neuropathy, unspecified: Secondary | ICD-10-CM

## 2023-02-12 MED ORDER — GLIPIZIDE ER 5 MG PO TB24
5.0000 mg | ORAL_TABLET | Freq: Every day | ORAL | 1 refills | Status: DC
Start: 2023-02-12 — End: 2023-04-11

## 2023-03-01 ENCOUNTER — Other Ambulatory Visit: Payer: Self-pay | Admitting: Family Medicine

## 2023-03-04 NOTE — Telephone Encounter (Signed)
Requested Prescriptions  Pending Prescriptions Disp Refills   pravastatin (PRAVACHOL) 80 MG tablet [Pharmacy Med Name: Pravastatin Sodium 80 MG Oral Tablet] 90 tablet 0    Sig: TAKE 1 TABLET BY MOUTH DAILY     Cardiovascular:  Antilipid - Statins Failed - 03/01/2023 10:47 PM      Failed - Lipid Panel in normal range within the last 12 months    Cholesterol, Total  Date Value Ref Range Status  09/06/2022 185 100 - 199 mg/dL Final   LDL Chol Calc (NIH)  Date Value Ref Range Status  09/06/2022 124 (H) 0 - 99 mg/dL Final   HDL  Date Value Ref Range Status  09/06/2022 31 (L) >39 mg/dL Final   Triglycerides  Date Value Ref Range Status  09/06/2022 169 (H) 0 - 149 mg/dL Final         Passed - Patient is not pregnant      Passed - Valid encounter within last 12 months    Recent Outpatient Visits           3 weeks ago Type 2 diabetes mellitus with diabetic neuropathy, without long-term current use of insulin (HCC)   [place] [facility] Malva Limes, MD   5 months ago Type 2 diabetes mellitus with diabetic neuropathy, without long-term current use of insulin (HCC)   [place] [facility] Malva Limes, MD   9 months ago Type 2 diabetes mellitus with diabetic neuropathy, without long-term current use of insulin (HCC)   [place] [place] [facility] Malva Limes, MD   1 year ago Type 2 diabetes mellitus with diabetic neuropathy, without long-term current use of insulin (HCC)   [place] [facility] [place], [place], PA-C   1 year ago Type 2 diabetes mellitus with diabetic neuropathy, without long-term current use of insulin (HCC)   [facility] Malva Limes, MD       Future Appointments             In 1 month Fisher, Demetrios Isaacs, MD [facility]

## 2023-03-22 ENCOUNTER — Encounter: Payer: Self-pay | Admitting: *Deleted

## 2023-03-22 ENCOUNTER — Ambulatory Visit
Admission: EM | Admit: 2023-03-22 | Discharge: 2023-03-22 | Disposition: A | Discharge status: Home / Self Care | Payer: 59 | Attending: Family Medicine | Admitting: Family Medicine

## 2023-03-22 ENCOUNTER — Other Ambulatory Visit: Payer: Self-pay

## 2023-03-22 DIAGNOSIS — F5104 Psychophysiologic insomnia: Secondary | ICD-10-CM | POA: Diagnosis not present

## 2023-03-22 DIAGNOSIS — Z566 Other physical and mental strain related to work: Secondary | ICD-10-CM

## 2023-03-22 MED ORDER — HYDROXYZINE HCL 50 MG PO TABS: 50.0000 mg | ORAL_TABLET | Freq: Every evening | ORAL | 0 refills | Status: AC | PRN

## 2023-03-22 NOTE — ED Provider Notes (Signed)
Douglas Murray    CSN: [id number] Arrival date & time: 03/22/23  1259      History   Chief Complaint Chief Complaint  Patient presents with   Panic Attack   Insomnia    HPI Douglas Murray is a 52 y.o. male.   Patient presents today for evaluation of tearfulness, feeling overwhelmed, depressed mood and anxiety ongoing for nearly a year.  According the patient all of his stress and anxiety is related to his current place of employment.  He reports several managerial changes have taken place over the course of the last year and over the last week it seems that he is becoming overwhelmed with stress and has not been sleeping well due to worrying about returning to work the next day.  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  He reports that his coworker has been out of work for almost a year.  He has taken all responsibilities of 2 employees opposed to simply doing tasks that were recently assigned to him.  He has no history of anxiety or depression. He has never been prescribed any medication for any mental health disorder. His wife advised him to come in as he has had 2 days of tearfulness and verbalized that he felt he needed some help to aid in him in coping with the this current situation. Patient denies any stressors within his household and endorses problems are related to work.  He denies SI/HI/VH/AH.   Past Medical History:  Diagnosis Date   Allergy    Cholelithiasis 2001   Identified on ultrasound. Asymptomatic.   Colon polyp 2010    Patient Active Problem List   Diagnosis Date Noted   Eczema 03/10/2015   Obesity 03/10/2015   Obstructive sleep apnea of adult 11/12/2013   Personal history of colonic polyps 09/14/2013   Diabetes mellitus, type 2 (HCC) 05/12/2012   Diverticulosis of colon without hemorrhage 09/15/2008   Lipodystrophy 11/03/2007    Allergic rhinitis 08/01/2006   Essential (primary) hypertension 08/01/2006    Past Surgical History:  Procedure Laterality Date   COLONOSCOPY  10/11/13   Dr Lemar Livings   WISDOM TOOTH EXTRACTION  1996       Home Medications    Prior to Admission medications   Medication Sig Start Date End Date Taking? Authorizing Provider  atenolol-chlorthalidone (TENORETIC) 50-25 MG tablet TAKE 1 TABLET BY MOUTH DAILY 06/10/22  Yes Malva Limes, MD  glipiZIDE (GLUCOTROL XL) 5 MG 24 hr tablet Take 1 tablet (5 mg total) by mouth daily with breakfast. 02/12/23  Yes Malva Limes, MD  hydrOXYzine (ATARAX) 50 MG tablet Take 1 tablet (50 mg total) by mouth at bedtime as needed and may repeat dose one time if needed (Insomnia and Anxiety). 03/22/23  Yes Bing Neighbors, NP  JARDIANCE 25 MG TABS tablet TAKE 1 TABLET (25 MG TOTAL) BY MOUTH DAILY. 02/03/23  Yes Malva Limes, MD  KLOR-CON M20 20 MEQ tablet TAKE 1 TABLET BY MOUTH EVERY DAY 12/18/22  Yes Malva Limes, MD  metFORMIN (GLUCOPHAGE-XR) 500 MG 24 hr tablet TAKE 2 TABLETS BY MOUTH DAILY  WITH BREAKFAST 06/10/22  Yes Malva Limes, MD  pravastatin (PRAVACHOL) 80 MG tablet TAKE 1 TABLET BY MOUTH DAILY 03/04/23  Yes Malva Limes, MD  TRADJENTA 5 MG TABS tablet TAKE 1 TABLET (5 MG TOTAL) BY MOUTH DAILY. TAKE  IN PLACE OF ONGLYZA Patient not taking: Reported on 02/07/2023 01/31/23   Malva Limes, MD    Family History Family History  Problem Relation Age of Onset   Diabetes Mother    Cancer Maternal Aunt     Social History Social History   Tobacco Use   Smoking status: Never   Smokeless tobacco: Never  Vaping Use   Vaping status: Never Used  Substance Use Topics   Alcohol use: No   Drug use: No     Allergies   Patient has no known allergies.   Review of Systems Review of Systems  Psychiatric/Behavioral:  The patient has insomnia.      Physical Exam Triage Vital Signs ED Triage Vitals  Encounter Vitals Group      BP 03/22/23 1318 (!) 157/97     Systolic BP Percentile --      Diastolic BP Percentile --      Pulse Rate 03/22/23 1318 60     Resp 03/22/23 1318 18     Temp 03/22/23 1318 98.4 F (36.9 C)     Temp src --      SpO2 03/22/23 1318 98 %     Weight --      Height --      Head Circumference --      Peak Flow --      Pain Score 03/22/23 1316 0     Pain Loc --      Pain Education --      Exclude from Growth Chart --    No data found.  Updated Vital Signs BP (!) 157/97   Pulse 60   Temp 98.4 F (36.9 C)   Resp 18   SpO2 98%   Visual Acuity Right Eye Distance:   Left Eye Distance:   Bilateral Distance:    Right Eye Near:   Left Eye Near:    Bilateral Near:     Physical Exam General appearance: Alert, well developed, well nourished, cooperative and in no distress Head: Normocephalic, without obvious abnormality, atraumatic Heart: Rate and rhythm normal. No gallop or murmurs noted on exam  Respiratory: Respirations even and unlabored, normal respiratory rate Extremities: No gross deformities Skin: Skin color, texture, turgor normal. No rashes seen  Neurologic: Oriented x 4. No focal neurological symptoms present Psych: Attentive, depressed mood and congruent affect. Negative suicidal ideation. Negative homicidal ideations. UC Treatments / Results  Labs (all labs ordered are listed, but only abnormal results are displayed) Labs Reviewed - No data to display  EKG   Radiology No results found.  Procedures Procedures (including critical care time)  Medications Ordered in UC Medications - No data to display  Initial Impression / Assessment and Plan / UC Course  I have reviewed the triage vital signs and the nursing notes.  Pertinent labs & imaging results that were available during my care of the patient were reviewed by me and considered in my medical decision making (see chart for details).    Provided resources as patient will benefit from counseling and  medication management. Encouraged patient to call the phone number located on the back of his insurance card  and provided additional resources for mental health resources. Agreed to trial Hydroxyzine 50 mg daily x at bedtime. Follow-up with PCP as needed. [facility] given as a crisis resource center if needed. Patient verbalized understanding and agreement with plan. Final Clinical Impressions(s) / UC Diagnoses   Final diagnoses:  Psychophysiological insomnia  Work-related stress  Discharge Instructions        Same-day appointments Operating hours and clinician availability may delay appointments until the next business day. [facility] and Current Patients  Psychiatry hours Monday-Friday, 8am-4:30pm (Eastern Time) If you are experiencing problems accessing [facility] send email to: [email or web address] or call [phone], Monday-Friday, 8:00am-4:30pm If you are having a psychiatric or medical emergency, please call 911 or go to the nearest emergency department. To reach the Suicide and Crisis Lifeline, please call or text [phone].  What is [facility]?  [facility] is an online service that provides same-day access to psychiatry to meet urgent mental health needs. The goal is to provide patients with timely intervention and keep them in an outpatient setting.  How long will I wait to see a clinician?  Our goal is to provide same-day care. However, operating hours and clinician availability may delay appointments until the next business day.  What if I can't get an appointment?  Please call [facility] at [phone] 8am to 5pm [place] Time or email [place]:  [email or web address]  to request the next available time. If you are having a psychiatric or medical emergency, please call 911 or go to the nearest emergency department. To reach the Suicide and Crisis Lifeline, please call or text [phone]. Do you accept insurance?  Yes. We accept most  commercial insurance plans. What information do you need from me? New patients should be ready to provide:  Photo ID  Insurance card  Payment information  For current patients, our specialists will confirm your documentation is on file.   Can I continue with regular care after my [facility] session?  Yes. Our goal is to make sure you have the follow-up care you need. Our specialist can help you schedule an appointment with an ongoing provider in addition to your On Demand session.  Can my current clinician provide treatment on [facility]?  No. Our [facility] clinicians are trained to assist with more immediate needs and provide support between regular appointments with your clinician.  What device can I use to connect with [facility]?  You can use any Wi-Fi-enabled device with a camera and a microphone, such as a smartphone, tablet, or computer.  Do I have to be at home to connect with [facility]?  No. As long as you are located within [place] or [place] you can connect with a provider. We do request that you connect from a safe and private location. Your clinician is required to document your location for emergency purposes.          ED Prescriptions     Medication Sig Dispense Auth. Provider   hydrOXYzine (ATARAX) 50 MG tablet Take 1 tablet (50 mg total) by mouth at bedtime as needed and may repeat dose one time if needed (Insomnia and Anxiety). 25 tablet Bing Neighbors, NP      PDMP not reviewed this encounter.   Bing Neighbors, NP 03/25/23 [phone]

## 2023-03-22 NOTE — ED Triage Notes (Signed)
Pt checked in reporting he felt off. Pt reports he has been under a lot of stress . Wife in room reports he is having a break down. Wife reports Pt has broke down crying several times today. Pt asked if he felt SI. Pt denies he wants to harm self. Wife raises her hand to say she feels like Pt wants to harm himself.

## 2023-03-22 NOTE — Discharge Instructions (Addendum)
   Same-day appointments Operating hours and clinician availability may delay appointments until the next business day. Douglas Murray New and Current Patients  Psychiatry hours Monday-Friday, 8am-4:30pm (Eastern Time) If you are experiencing problems accessing [facility] send email to: [email or web address] or call [phone], Monday-Friday, 8:00am-4:30pm If you are having a psychiatric or medical emergency, please call 911 or go to the nearest emergency department. To reach the Suicide and Crisis Lifeline, please call or text [phone].  What is [facility]?  [facility] is an online service that provides same-day access to psychiatry to meet urgent mental health needs. The goal is to provide patients with timely intervention and keep them in an outpatient setting.  How long will I wait to see a clinician?  Our goal is to provide same-day care. However, operating hours and clinician availability may delay appointments until the next business day.  What if I can't get an appointment?  Please call [facility] at [phone] 8am to 5pm Eastern Time or email us:  [email or web address]  to request the next available time. If you are having a psychiatric or medical emergency, please call 911 or go to the nearest emergency department. To reach the Suicide and Crisis Lifeline, please call or text 988. Do you accept insurance?  Yes. We accept most commercial insurance plans. What information do you need from me? New patients should be ready to provide:  Photo ID  Insurance card  Payment information  For current patients, our specialists will confirm your documentation is on file.   Can I continue with regular care after my [facility] session?  Yes. Our goal is to make sure you have the follow-up care you need. Our specialist can help you schedule an appointment with an ongoing provider in addition to your On Demand session.  Can my current clinician provide  treatment on [facility]?  No. Our [facility] clinicians are trained to assist with more immediate needs and provide support between regular appointments with your clinician.  What device can I use to connect with [facility]?  You can use any Wi-Fi-enabled device with a camera and a microphone, such as a smartphone, tablet, or computer.  Do I have to be at home to connect with [facility]?  No. As long as you are located within [place] or [place] you can connect with a provider. We do request that you connect from a safe and private location. Your clinician is required to document your location for emergency purposes.  

## 2023-03-25 ENCOUNTER — Ambulatory Visit: Payer: 59 | Admitting: Physician Assistant

## 2023-03-26 ENCOUNTER — Ambulatory Visit: Payer: 59 | Admitting: Physician Assistant

## 2023-03-26 ENCOUNTER — Encounter: Payer: Self-pay | Admitting: Physician Assistant

## 2023-03-26 VITALS — BP 130/92 | HR 66 | Ht 71.0 in | Wt 224.6 lb

## 2023-03-26 DIAGNOSIS — F419 Anxiety disorder, unspecified: Secondary | ICD-10-CM | POA: Diagnosis not present

## 2023-03-26 DIAGNOSIS — E1140 Type 2 diabetes mellitus with diabetic neuropathy, unspecified: Secondary | ICD-10-CM | POA: Diagnosis not present

## 2023-03-26 DIAGNOSIS — Z23 Encounter for immunization: Secondary | ICD-10-CM | POA: Diagnosis not present

## 2023-03-26 DIAGNOSIS — F32A Depression, unspecified: Secondary | ICD-10-CM | POA: Diagnosis not present

## 2023-03-26 DIAGNOSIS — Z7984 Long term (current) use of oral hypoglycemic drugs: Secondary | ICD-10-CM

## 2023-03-26 MED ORDER — BUSPIRONE HCL 5 MG PO TABS
5.0000 mg | ORAL_TABLET | Freq: Two times a day (BID) | ORAL | 0 refills | Status: DC
Start: 2023-03-26 — End: 2023-04-17

## 2023-03-26 NOTE — Progress Notes (Signed)
Established patient visit  Patient: Douglas Murray   DOB: 1971-06-23   52 y.o. Male  MRN: [id number] Visit Date: 03/26/2023  Today's healthcare provider: Debera Lat, PA-C   Chief Complaint  Patient presents with   Medical Management of Chronic Issues    Depression started last week, Saturday is when the depression heightened. No prior treatment, not as much stress today   Subjective     Discussed the use of AI scribe software for clinical note transcription with the patient, who gave verbal consent to proceed.  History of Present Illness   The patient, a middle-aged individual with a history of hypertension, obstructive sleep apnea, and diabetes, presents with increased stress and anxiety. The patient reports feeling overwhelmed due to an increased workload at work, which has been exacerbated by changes in management and the additional responsibilities from a sick colleague. The patient describes episodes of breakdowns, sleep disturbances, and an inability to relax. The patient's wife corroborates these symptoms and expresses concern about the patient's mental health. The patient denies any previous diagnosis of depression, anxiety, or any other mental health conditions. However, there is a family history of depression and anxiety. The patient's symptoms appear to be more consistent with anxiety, as he reports feelings of breathlessness and needing to calm themselves down. The patient also reports a decrease in focus and task completion, which he attributes to his anxiety.           03/26/2023   11:47 AM 02/07/2023    4:10 PM 09/06/2022    2:54 PM  Depression screen PHQ 2/9  Decreased Interest 2 1 2   Down, Depressed, Hopeless 2 1 1   PHQ - 2 Score 4 2 3   Altered sleeping 2 1 0  Tired, decreased energy 3 2 1   Change in appetite 2 2 1   Feeling bad or failure about yourself  3 0 0  Trouble concentrating  1 1  Moving slowly or fidgety/restless 0 0 0  Suicidal thoughts 0 0 0   PHQ-9 Score 14 8 6   Difficult doing work/chores Not difficult at all Somewhat difficult Very difficult      03/26/2023   11:48 AM  GAD 7 : Generalized Anxiety Score  Nervous, Anxious, on Edge 2  Control/stop worrying 3  Worry too much - different things 3  Trouble relaxing 2  Restless 1  Easily annoyed or irritable 2  Afraid - awful might happen 2  Total GAD 7 Score 15  Anxiety Difficulty Extremely difficult    Medications: Outpatient Medications Prior to Visit  Medication Sig   atenolol-chlorthalidone (TENORETIC) 50-25 MG tablet TAKE 1 TABLET BY MOUTH DAILY   glipiZIDE (GLUCOTROL XL) 5 MG 24 hr tablet Take 1 tablet (5 mg total) by mouth daily with breakfast.   hydrOXYzine (ATARAX) 50 MG tablet Take 1 tablet (50 mg total) by mouth at bedtime as needed and may repeat dose one time if needed (Insomnia and Anxiety).   JARDIANCE 25 MG TABS tablet TAKE 1 TABLET (25 MG TOTAL) BY MOUTH DAILY.   KLOR-CON M20 20 MEQ tablet TAKE 1 TABLET BY MOUTH EVERY DAY   metFORMIN (GLUCOPHAGE-XR) 500 MG 24 hr tablet TAKE 2 TABLETS BY MOUTH DAILY  WITH BREAKFAST   pravastatin (PRAVACHOL) 80 MG tablet TAKE 1 TABLET BY MOUTH DAILY   TRADJENTA 5 MG TABS tablet TAKE 1 TABLET (5 MG TOTAL) BY MOUTH DAILY. TAKE IN PLACE OF ONGLYZA   No facility-administered medications prior to visit.    Review of  Systems  All other systems reviewed and are negative.  Except see HPI       Objective    BP (!) 130/92 (BP Location: Right Arm, Patient Position: Sitting, Cuff Size: Large)   Pulse 66   Ht 5\' 11"  (1.803 m)   Wt 224 lb 9.6 oz (101.9 kg)   SpO2 99%   BMI 31.33 kg/m     Physical Exam Constitutional:      General: He is not in acute distress.    Appearance: Normal appearance. He is not diaphoretic.  HENT:     Head: Normocephalic.  Eyes:     Conjunctiva/sclera: Conjunctivae normal.  Pulmonary:     Effort: Pulmonary effort is normal. No respiratory distress.  Neurological:     Mental Status: He is  alert and oriented to person, place, and time. Mental status is at baseline.      No results found for any visits on 03/26/23.  Assessment & Plan        Anxiety and Depression Moderate anxiety and depression based on screening. Patient expresses feeling overwhelmed, difficulty focusing, and episodes of crying. Family history of anxiety and depression. Patient prefers therapy over medication initially. -Refer to psychiatry and behavioral health for further evaluation and therapy. -Start on 5 mg of an anxiety-focused medication, initially 1 tablet daily, increasing to 2 tablets daily after 1 week if well-tolerated/Buspar -Encourage use of deep breathing exercises, meditation, and physical activity as coping mechanisms for anxiety. -Follow-up in 4 weeks to assess response to medication.  Obstructive Sleep Apnea Chronic and stable Patient has been prescribed CPAP machine but is not using it regularly. -Encourage regular use of CPAP machine to improve sleep quality and potentially alleviate some symptoms of anxiety and depression.  Hypertension Chronic and stable Well-controlled on medication. Advised low salt diet and daily exercise -Continue current medication regimen.  General Health Maintenance -Encourage adherence to a healthy diet (e.g., DASH diet, Mediterranean diet). -Schedule flu shot.     No follow-ups on file.     The patient was advised to call back or seek an in-person evaluation if the symptoms worsen or if the condition fails to improve as anticipated.  I discussed the assessment and treatment plan with the patient. The patient was provided an opportunity to ask questions and all were answered. The patient agreed with the plan and demonstrated an understanding of the instructions.  I, Debera Lat, PA-C have reviewed all documentation for this visit. The documentation on  03/26/23  for the exam, diagnosis, procedures, and orders are all accurate and complete.  Debera Lat, [facility], MMS [facility] [phone] (phone) [phone] (fax)  [facility]

## 2023-03-26 NOTE — Patient Instructions (Signed)

## 2023-03-27 LAB — MICROALBUMIN / CREATININE URINE RATIO
Creatinine, Urine: 56.3 mg/dL
Microalb/Creat Ratio: <5 mg/g{creat} (ref 0–29)
Microalbumin, Urine: <3 ug/mL

## 2023-04-11 ENCOUNTER — Ambulatory Visit: Payer: 59 | Admitting: Family Medicine

## 2023-04-11 VITALS — BP 129/88 | HR 70 | Temp 97.9°F | Ht 71.0 in | Wt 227.4 lb

## 2023-04-11 DIAGNOSIS — E1140 Type 2 diabetes mellitus with diabetic neuropathy, unspecified: Secondary | ICD-10-CM | POA: Diagnosis not present

## 2023-04-11 DIAGNOSIS — Z7984 Long term (current) use of oral hypoglycemic drugs: Secondary | ICD-10-CM

## 2023-04-11 LAB — POCT GLYCOSYLATED HEMOGLOBIN (HGB A1C)
Est. average glucose Bld gHb Est-mCnc: 189
Hemoglobin A1C: 8.2 % — AB (ref 4.0–5.6)

## 2023-04-11 MED ORDER — GLIPIZIDE ER 5 MG PO TB24
5.0000 mg | ORAL_TABLET | Freq: Every day | ORAL | 3 refills | Status: DC
Start: 2023-04-11 — End: 2023-09-02

## 2023-04-11 NOTE — Progress Notes (Unsigned)
Established patient visit   Patient: Douglas Murray   DOB: Dec 16, 1970   52 y.o. Male  MRN: [id number] Visit Date: 04/11/2023  Today's healthcare provider: Mila Merry, MD   Chief Complaint  Patient presents with   Diabetes   Subjective    Discussed the use of AI scribe software for clinical note transcription with the patient, who gave verbal consent to proceed.  History of Present Illness   The patient, with a history of diabetes, presents for a follow-up visit to manage his blood sugar levels. He was started on glipizide after his last visit in July when his A1c was 9.5. He continues on previously prescribed Tradjenta, Jardiance, and metformin without any reported side effects. His blood sugar levels have been around 160 at various times of the day, with no episodes of hypoglycemia. He reports struggling with maintaining a healthy diet and has been experiencing a decreased appetite, often missing meals.  In addition to his diabetes, the patient has been experiencing anxiety, which he attributes to work-related stress and pressure. He describes symptoms of a panic attack, including nervousness, shakiness, and difficulty focusing. He has not experienced any side effects from the medication. He saw Debera Lat on 9/4 and prescribed a low dose of buspirone, and feels slight improved, having no apparent side effects.       Medications: Outpatient Medications Prior to Visit  Medication Sig   atenolol-chlorthalidone (TENORETIC) 50-25 MG tablet TAKE 1 TABLET BY MOUTH DAILY   busPIRone (BUSPAR) 5 MG tablet Take 1 tablet (5 mg total) by mouth 2 (two) times daily.   hydrOXYzine (ATARAX) 50 MG tablet Take 1 tablet (50 mg total) by mouth at bedtime as needed and may repeat dose one time if needed (Insomnia and Anxiety).   JARDIANCE 25 MG TABS tablet TAKE 1 TABLET (25 MG TOTAL) BY MOUTH DAILY.   KLOR-CON M20 20 MEQ tablet TAKE 1 TABLET BY MOUTH EVERY DAY   metFORMIN  (GLUCOPHAGE-XR) 500 MG 24 hr tablet TAKE 2 TABLETS BY MOUTH DAILY  WITH BREAKFAST   pravastatin (PRAVACHOL) 80 MG tablet TAKE 1 TABLET BY MOUTH DAILY   TRADJENTA 5 MG TABS tablet TAKE 1 TABLET (5 MG TOTAL) BY MOUTH DAILY. TAKE IN PLACE OF ONGLYZA   [DISCONTINUED] glipiZIDE (GLUCOTROL XL) 5 MG 24 hr tablet Take 1 tablet (5 mg total) by mouth daily with breakfast.   No facility-administered medications prior to visit.   Review of Systems {Insert previous labs (optional):23779} {See past labs  Heme  Chem  Endocrine  Serology  Results Review (optional):1}   Objective    BP 129/88 (BP Location: Right Arm, Patient Position: Sitting, Cuff Size: Normal)   Pulse 70   Temp 97.9 F (36.6 C) (Oral)   Ht 5\' 11"  (1.803 m)   Wt 227 lb 6.4 oz (103.1 kg)   SpO2 97%   BMI 31.72 kg/m {Insert last BP/Wt (optional):23777}{See vitals history (optional):1}  Physical Exam  General appearance: Mildly obese male, cooperative and in no acute distress Head: Normocephalic, without obvious abnormality, atraumatic Respiratory: Respirations even and unlabored, normal respiratory rate Extremities: All extremities are intact.  Skin: Skin color, texture, turgor normal. No rashes seen  Psych: Appropriate mood and affect. Neurologic: Mental status: Alert, oriented to person, place, and time, thought content appropriate.     Results for orders placed or performed in visit on 04/11/23  POCT glycosylated hemoglobin (Hb A1C)  Result Value Ref Range   Hemoglobin A1C 8.2 (A) 4.0 -  5.6 %   Est. average glucose Bld gHb Est-mCnc 189     Assessment & Plan        Type 2 Diabetes Mellitus Improved glycemic control with A1c of 8.2, down from previous. No hypoglycemic episodes reported. Patient is on Glipizide, Tradjenta, Jardiance, and Metformin. Blood glucose readings around 160 at various times of the day. -Continue current regimen. -Refill Glipizide with 90-day prescription. -Check A1c in 3-4  months.  Anxiety Recent onset of symptoms. Started on Buspirone 5mg  BID about 2 weeks ago with some improvement. No side effects reported. -Increase Buspirone to 5mg  in the morning and 10mg  at night for a week. -If tolerated, increase to 10mg  BID. -If dose increase is effective, send MyChart message to provider for refill of 10mg  tablets and cancel follow-up appointment.  General Health Maintenance / Followup Plans -Schedule physical exam in January 2025. -Complete yearly blood work at time of physical.    Return in about 4 months (around 08/11/2023) for Yearly Physical.      Mila Merry, MD  [facility] [phone] (phone) [phone] (fax)  [facility]

## 2023-04-11 NOTE — Patient Instructions (Addendum)
Please review the attached list of medications and notify my office if there are any errors.   Increase buspirone to one tablet in the morning and two tablets a night for the week, then increase to two tablets twice a day. If that is working well, then send me a Clinical cytogeneticist message before it runs out and I'll send in a prescription for 10mg  tablets.

## 2023-04-17 ENCOUNTER — Other Ambulatory Visit: Payer: Self-pay | Admitting: Physician Assistant

## 2023-04-17 DIAGNOSIS — F419 Anxiety disorder, unspecified: Secondary | ICD-10-CM

## 2023-04-17 NOTE — Telephone Encounter (Signed)
Requested Prescriptions  Pending Prescriptions Disp Refills   busPIRone (BUSPAR) 5 MG tablet [Pharmacy Med Name: BUSPIRONE HCL 5 MG TABLET] 180 tablet 1    Sig: TAKE 1 TABLET BY MOUTH TWICE A DAY     Psychiatry: Anxiolytics/Hypnotics - Non-controlled Passed - 04/17/2023  8:31 AM      Passed - Valid encounter within last 12 months    Recent Outpatient Visits           6 days ago Type 2 diabetes mellitus with diabetic neuropathy, without long-term current use of insulin (HCC)   [place] Malva Limes, MD   3 weeks ago Anxiety and depression   [place] [facility], [place], PA-C   2 months ago Type 2 diabetes mellitus with diabetic neuropathy, without long-term current use of insulin (HCC)   [place] [facility] Malva Limes, MD   7 months ago Type 2 diabetes mellitus with diabetic neuropathy, without long-term current use of insulin [facility])   [facility] Malva Limes, MD   11 months ago Type 2 diabetes mellitus with diabetic neuropathy, without long-term current use of insulin (HCC)   [facility] Malva Limes, MD       Future Appointments             In 6 days Debera Lat, PA-C [facility], [facility]   In 4 months Fisher, Demetrios Isaacs, MD [facility], [facility]

## 2023-04-18 ENCOUNTER — Ambulatory Visit (INDEPENDENT_AMBULATORY_CARE_PROVIDER_SITE_OTHER): Payer: 59 | Admitting: Psychology

## 2023-04-18 DIAGNOSIS — F4323 Adjustment disorder with mixed anxiety and depressed mood: Secondary | ICD-10-CM | POA: Diagnosis not present

## 2023-04-18 NOTE — Progress Notes (Signed)
[facility] Counselor Initial Adult Exam  Name: Douglas Murray Date: 04/18/2023 MRN: [id number] DOB: 1970/09/14 PCP: Malva Limes, MD  Time spent: 50 mins   start time: 1400     end time: 1450  Guardian/Payee:  Pt    Paperwork requested: No   Reason for Visit /Presenting Problem: Pt presents for session via Caregility video, granting consent for the session.  Pt states he is in his vehicle with no one else around and pt understands the limits of virtual sessions.  I shared with pt that I am in my office with no one else here.  Pt goes by "Douglas Murray".  Mental Status Exam: Appearance:   Casual     Behavior:  Appropriate  Motor:  Normal  Speech/Language:   Clear and Coherent  Affect:  Appropriate  Mood:  normal  Thought process:  normal  Thought content:    WNL  Sensory/Perceptual disturbances:    WNL  Orientation:  oriented to person, place, and time/date  Attention:  Good  Concentration:  Good  Memory:  WNL  Fund of knowledge:   Good  Insight:    Good  Judgment:   Good  Impulse Control:  Good   Reported Symptoms:  Pt shares that he is seeking therapy because he was having a tough day and felt like he had a "nervous breakdown" about a month ago, primarily because of job stress; he shares he did not have anyone to talk to about it.  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    Risk Assessment: Danger to Self:  No Self-injurious Behavior: No Danger to Others: No Duty to Warn:no Physical Aggression / Violence:No  Access to Firearms a concern: No  Gang Involvement:No  Patient / guardian was educated about steps to take if suicide or homicide risk level increases between visits: n/a While future psychiatric events cannot be accurately predicted, the patient does not currently require acute inpatient psychiatric care and does not currently meet [facility] involuntary commitment  criteria.  Substance Abuse History: Current substance abuse: No     Past Psychiatric History:   Previous psychological history is significant for depression Outpatient Providers:[facility] (1 visit) History of Psych Hospitalization: No  Psychological Testing:  none    Abuse History:  Victim of: No.,  none    Report needed: No. Victim of Neglect:No. Perpetrator of  none   Witness / Exposure to Domestic Violence: No   Protective Services Involvement: No  Witness to [employer] Violence:  No   Family History:  Family History  Problem Relation Age of Onset   Diabetes Mother    Cancer Maternal Aunt     Living situation: the patient lives with their family; pt was born and raised in [place]; has a sister on his mom's side and a brother and 2 sisters on his father's side; parents are still living but never married; mom lives in [place] and father lives in [place]  Sexual Orientation: Straight  Relationship Status: married 22 yrs  Name of spouse / other: Douglas Murray If a parent, number of children / ages: 70 yo son-Shanna; [employer] MS (7th grade)  Support Systems: spouse  Surveyor, quantity Stress:  No   Income/Employment/Disability: Employment; 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[place]; he has been there for 10 yrs; pt works 7am to 330pm, M-F; works some Saturdays; 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 History of legal issue / charges:   none  Medical History/Surgical History: reviewed Past Medical History:  Diagnosis Date   Allergy    Cholelithiasis 2001   Identified on ultrasound. Asymptomatic.   Colon polyp 2010    Past Surgical History:  Procedure Laterality Date   COLONOSCOPY  10/11/13   Dr Lemar Livings   WISDOM TOOTH EXTRACTION  1996    Medications: Current Outpatient Medications  Medication Sig Dispense Refill   atenolol-chlorthalidone (TENORETIC) 50-25 MG tablet TAKE 1 TABLET BY MOUTH DAILY 90 tablet 4   busPIRone (BUSPAR) 5 MG tablet TAKE 1 TABLET BY MOUTH TWICE A DAY 30 tablet 0   glipiZIDE (GLUCOTROL XL) 5 MG 24 hr tablet Take 1 tablet (5 mg total) by mouth daily with breakfast. 90 tablet 3   hydrOXYzine (ATARAX) 50 MG tablet Take 1 tablet (50 mg total) by mouth at bedtime as needed and may repeat dose one time if needed (Insomnia and Anxiety). 25 tablet 0   JARDIANCE 25 MG TABS tablet TAKE 1 TABLET (25 MG TOTAL) BY MOUTH DAILY. 90 tablet 4   KLOR-CON M20 20 MEQ tablet TAKE 1 TABLET BY MOUTH EVERY DAY 90 tablet 3   metFORMIN (GLUCOPHAGE-XR) 500 MG 24 hr tablet TAKE 2 TABLETS BY MOUTH DAILY  WITH BREAKFAST 180 tablet 4   pravastatin (PRAVACHOL) 80 MG tablet TAKE 1 TABLET BY MOUTH DAILY 90 tablet 0   TRADJENTA 5 MG TABS tablet TAKE 1 TABLET (5 MG TOTAL) BY MOUTH DAILY. TAKE IN PLACE OF ONGLYZA 90 tablet 1   No current facility-administered medications for this visit.    No Known Allergies  Diagnoses:  Adjustment disorder with mixed anxiety and depressed mood  Plan of Care: Encouraged pt to continue with his self care activities and we will meet in 2 wks for a follow up session (05/02/2023)   Karie Kirks, [facility]

## 2023-04-20 NOTE — Progress Notes (Unsigned)
Established patient visit  Patient: Douglas Murray   DOB: [birth year]   52 y.o. Male  MRN: [id number] Visit Date: 04/23/2023  Today's healthcare provider: Debera Lat, PA-C   No chief complaint on file.  Subjective    HPI  *** Discussed the use of AI scribe software for clinical note transcription with the patient, who gave verbal consent to proceed.  History of Present Illness               03/26/2023   11:47 AM 02/07/2023    4:10 PM 09/06/2022    2:54 PM  Depression screen PHQ 2/9  Decreased Interest 2 1 2   Down, Depressed, Hopeless 2 1 1   PHQ - 2 Score 4 2 3   Altered sleeping 2 1 0  Tired, decreased energy 3 2 1   Change in appetite 2 2 1   Feeling bad or failure about yourself  3 0 0  Trouble concentrating  1 1  Moving slowly or fidgety/restless 0 0 0  Suicidal thoughts 0 0 0  PHQ-9 Score 14 8 6   Difficult doing work/chores Not difficult at all Somewhat difficult Very difficult      03/26/2023   11:48 AM  GAD 7 : Generalized Anxiety Score  Nervous, Anxious, on Edge 2  Control/stop worrying 3  Worry too much - different things 3  Trouble relaxing 2  Restless 1  Easily annoyed or irritable 2  Afraid - awful might happen 2  Total GAD 7 Score 15  Anxiety Difficulty Extremely difficult    Medications: Outpatient Medications Prior to Visit  Medication Sig   atenolol-chlorthalidone (TENORETIC) 50-25 MG tablet TAKE 1 TABLET BY MOUTH DAILY   busPIRone (BUSPAR) 5 MG tablet TAKE 1 TABLET BY MOUTH TWICE A DAY   glipiZIDE (GLUCOTROL XL) 5 MG 24 hr tablet Take 1 tablet (5 mg total) by mouth daily with breakfast.   hydrOXYzine (ATARAX) 50 MG tablet Take 1 tablet (50 mg total) by mouth at bedtime as needed and may repeat dose one time if needed (Insomnia and Anxiety).   JARDIANCE 25 MG TABS tablet TAKE 1 TABLET (25 MG TOTAL) BY MOUTH DAILY.   KLOR-CON M20 20 MEQ tablet TAKE 1 TABLET BY MOUTH EVERY DAY   metFORMIN (GLUCOPHAGE-XR) 500 MG 24 hr tablet TAKE 2  TABLETS BY MOUTH DAILY  WITH BREAKFAST   pravastatin (PRAVACHOL) 80 MG tablet TAKE 1 TABLET BY MOUTH DAILY   TRADJENTA 5 MG TABS tablet TAKE 1 TABLET (5 MG TOTAL) BY MOUTH DAILY. TAKE IN PLACE OF ONGLYZA   No facility-administered medications prior to visit.    Review of Systems  All other systems reviewed and are negative.  Except see HPI   {Insert previous labs (optional):23779} {See past labs  Heme  Chem  Endocrine  Serology  Results Review (optional):1}   Objective    There were no vitals taken for this visit. {Insert last BP/Wt (optional):23777}{See vitals history (optional):1}   Physical Exam Constitutional:      General: He is not in acute distress.    Appearance: Normal appearance. He is not diaphoretic.  HENT:     Head: Normocephalic.  Eyes:     Conjunctiva/sclera: Conjunctivae normal.  Pulmonary:     Effort: Pulmonary effort is normal. No respiratory distress.  Neurological:     Mental Status: He is alert and oriented to person, place, and time. Mental status is at baseline.      No results found for any visits on 04/23/23.  Assessment &  Plan    *** Assessment and Plan              No follow-ups on file.     The patient was advised to call back or seek an in-person evaluation if the symptoms worsen or if the condition fails to improve as anticipated.  I discussed the assessment and treatment plan with the patient. The patient was provided an opportunity to ask questions and all were answered. The patient agreed with the plan and demonstrated an understanding of the instructions.  I, Debera Lat, PA-C have reviewed all documentation for this visit. The documentation on  04/23/23  for the exam, diagnosis, procedures, and orders are all accurate and complete.  Debera Lat, [facility], MMS [facility] [phone] (phone) [phone] (fax)  [facility]

## 2023-04-23 ENCOUNTER — Ambulatory Visit: Payer: Self-pay | Admitting: Psychology

## 2023-04-23 ENCOUNTER — Ambulatory Visit: Payer: 59 | Admitting: Physician Assistant

## 2023-04-23 VITALS — BP 124/103 | HR 67 | Ht 71.0 in

## 2023-04-23 DIAGNOSIS — I152 Hypertension secondary to endocrine disorders: Secondary | ICD-10-CM | POA: Diagnosis not present

## 2023-04-23 DIAGNOSIS — F419 Anxiety disorder, unspecified: Secondary | ICD-10-CM | POA: Diagnosis not present

## 2023-04-23 DIAGNOSIS — E1140 Type 2 diabetes mellitus with diabetic neuropathy, unspecified: Secondary | ICD-10-CM | POA: Diagnosis not present

## 2023-04-23 DIAGNOSIS — E1159 Type 2 diabetes mellitus with other circulatory complications: Secondary | ICD-10-CM

## 2023-04-23 DIAGNOSIS — Z7984 Long term (current) use of oral hypoglycemic drugs: Secondary | ICD-10-CM

## 2023-04-23 MED ORDER — VALSARTAN 40 MG PO TABS
40.0000 mg | ORAL_TABLET | Freq: Every day | ORAL | 3 refills | Status: DC
Start: 2023-04-23 — End: 2023-06-09

## 2023-04-24 ENCOUNTER — Encounter: Payer: Self-pay | Admitting: Physician Assistant

## 2023-04-24 DIAGNOSIS — F419 Anxiety disorder, unspecified: Secondary | ICD-10-CM | POA: Insufficient documentation

## 2023-04-24 DIAGNOSIS — I152 Hypertension secondary to endocrine disorders: Secondary | ICD-10-CM | POA: Insufficient documentation

## 2023-04-29 ENCOUNTER — Encounter: Payer: 59 | Attending: Physician Assistant | Admitting: Dietician

## 2023-04-29 ENCOUNTER — Encounter: Payer: Self-pay | Admitting: Dietician

## 2023-04-29 DIAGNOSIS — Z713 Dietary counseling and surveillance: Secondary | ICD-10-CM | POA: Insufficient documentation

## 2023-04-29 DIAGNOSIS — Z7984 Long term (current) use of oral hypoglycemic drugs: Secondary | ICD-10-CM | POA: Insufficient documentation

## 2023-04-29 DIAGNOSIS — E1140 Type 2 diabetes mellitus with diabetic neuropathy, unspecified: Secondary | ICD-10-CM | POA: Diagnosis present

## 2023-04-29 NOTE — Patient Instructions (Addendum)
Consider a daily Multivitmain and request B12 levels be checked when your next labs are drawn.  Contact your PCP and ask for them to send a prescription for a glucometer, strips, and lancets.  Check your blood sugar each morning before eating or drinking (fasting). Look for numbers under 130 mg/dL Bring your meter or a log of your numbers to our next visit!  Your goal A1c is below 7.0%  Increase your physical activity from 2 to 3 or 4 days each week. Move your exercise to after your dinner meals. Look for your fasting numbers to be lower the next morning.  Switch to sugar free beverages as often as you possibly can. Use Splenda to sweeten your coffee, switch to Coke ZERO or Diet sodas, look for sugar-free fruit punch options. Try to drink 3-4 bottles of water per day.  Work on lowering your consumption of fried foods by choosing grilled, baked or broiled meats. If having fried foods, choose smaller portions!!

## 2023-04-29 NOTE — Progress Notes (Signed)
Diabetes Self-Management Education  Visit Type: First/Initial  Appt. Start Time: 1400 Appt. End Time: 1525  04/29/2023  [name], identified by name and date of birth, is a 52 y.o. male with a diagnosis of Diabetes: Type 2.   ASSESSMENT  There were no vitals taken for this visit. There is no height or weight on file to calculate BMI.  Pt reports taking Glipizide, Jardiance, Tradjenta, and Metformin for DM, no side effects or hypoglycemia reported. Pt reports taking metformin for the last 10 years, states they have never had their B12 levels checked. Pt reports checking BG every other, sometimes in the morning (~190 mg/dL) and sometimes in the afternoon (low 200s).   Pt reports history of feeling numbness in the tips of their toes last year, has since subsided. Pt reports high stress levels at work, taking on responsibilities of their boss who has been on sick leave for a year, may watch TV or walk to destress, reports buspar is helping a little. Pt reports going to rec center twice a week to exercise for ~30 minutes, tries to keep a steady pace during entire workout.Pt reports going a long period of time between meals because of work, 9:30 am - 2:00 PM, states they get busy and forget to eat, typically eats fastfood/convenience foods. Pt reports drinking 2 bottles of plain water per day, mostly drinks water when taking medicine.    Diabetes Self-Management Education - 04/29/23 1413       Visit Information   Visit Type First/Initial      Initial Visit   Diabetes Type Type 2    Date Diagnosed 2013    Are you currently following a meal plan? No    Are you taking your medications as prescribed? Yes      Psychosocial Assessment   Patient Belief/Attitude about Diabetes Denial   Lack of information   What is the hardest part about your diabetes right now, causing you the most concern, or is the most worrisome to you about your diabetes?   Making healty food and beverage  choices;Getting support / problem solving    Self-care barriers Lack of material resources    Self-management support Doctor's office    Other persons present Patient    Patient Concerns Medication;Nutrition/Meal planning;Glycemic Control    Special Needs None    Preferred Learning Style Other (comment)    Learning Readiness Contemplating    How often do you need to have someone help you when you read instructions, pamphlets, or other written materials from your doctor or pharmacy? 1 - Never    What is the last grade level you completed in school? 12th grade      Pre-Education Assessment   Patient understands the diabetes disease and treatment process. Needs Instruction    Patient understands incorporating nutritional management into lifestyle. Needs Instruction    Patient undertands incorporating physical activity into lifestyle. Needs Instruction    Patient understands using medications safely. Needs Instruction    Patient understands monitoring blood glucose, interpreting and using results Needs Instruction    Patient understands prevention, detection, and treatment of acute complications. Needs Instruction    Patient understands prevention, detection, and treatment of chronic complications. Needs Instruction    Patient understands how to develop strategies to address psychosocial issues. Needs Instruction    Patient understands how to develop strategies to promote health/change behavior. Needs Instruction      Complications   Last HgB A1C per patient/outside source 8.2 %  04/11/2023   How often do you check your blood sugar? 3-4 times / week    Fasting Blood glucose range (mg/dL) 132-440    Postprandial Blood glucose range (mg/dL) >102    Have you had a dilated eye exam in the past 12 months? Yes    Have you had a dental exam in the past 12 months? No    Are you checking your feet? No      Dietary Intake   Breakfast Honey smacks, 2% milk, coffee w/ creamer and sugar    Lunch  [employer] Fried chicken, fries, Coke    Snack (afternoon) 4 PB/Chocolate shortbread cookies, 1 bag of BBQ chips    Dinner Fried chicken, corn, Fruit punch    Beverage(s) coffee, Coke, Fruit punch      Activity / Exercise   Activity / Exercise Type ADL's;Light (walking / raking leaves)    How many days per week do you exercise? 2    How many minutes per day do you exercise? 30    Total minutes per week of exercise 60      Patient Education   Previous Diabetes Education No    Disease Pathophysiology Factors that contribute to the development of diabetes;Explored patient's options for treatment of their diabetes;Definition of diabetes, type 1 and 2, and the diagnosis of diabetes    Healthy Eating Plate Method;Role of diet in the treatment of diabetes and the relationship between the three main macronutrients and blood glucose level;Meal timing in regards to the patients' current diabetes medication.    Being Active Role of exercise on diabetes management, blood pressure control and cardiac health.;Identified with patient nutritional and/or medication changes necessary with exercise.    Medications Reviewed patients medication for diabetes, action, purpose, timing of dose and side effects.    Monitoring Identified appropriate SMBG and/or A1C goals.    Acute complications Taught prevention, symptoms, and  treatment of hypoglycemia - the 15 rule.    Chronic complications Relationship between chronic complications and blood glucose control;Assessed and discussed foot care and prevention of foot problems;Identified and discussed with patient  current chronic complications    Diabetes Stress and Support Role of stress on diabetes    Lifestyle and Health Coping Lifestyle issues that need to be addressed for better diabetes care      Individualized Goals (developed by patient)   Nutrition Follow meal plan discussed    Physical Activity Exercise 3-5 times per week    Medications take my medication as  prescribed    Monitoring  Test my blood glucose as discussed    Problem Solving Eating Pattern    Reducing Risk examine blood glucose patterns    Health Coping Ask for help with psychological, social, or emotional issues   High stress at work, using Teacher, adult education     Post-Education Assessment   Patient understands the diabetes disease and treatment process. Needs Review    Patient understands incorporating nutritional management into lifestyle. Needs Review    Patient undertands incorporating physical activity into lifestyle. Needs Review    Patient understands using medications safely. Needs Review    Patient understands monitoring blood glucose, interpreting and using results Needs Review    Patient understands prevention, detection, and treatment of acute complications. Needs Review    Patient understands prevention, detection, and treatment of chronic complications. Needs Review    Patient understands how to develop strategies to address psychosocial issues. Needs Review    Patient understands how to develop strategies  to promote health/change behavior. Needs Review      Outcomes   Expected Outcomes Demonstrated interest in learning but significant barriers to change             Individualized Plan for Diabetes Self-Management Training:   Learning Objective:  Patient will have a greater understanding of diabetes self-management. Patient education plan is to attend individual and/or group sessions per assessed needs and concerns.   Plan:   Patient Instructions  Consider a daily Multivitmain and request B12 levels be checked when your next labs are drawn.  Contact your PCP and ask for them to send a prescription for a glucometer, strips, and lancets.  Check your blood sugar each morning before eating or drinking (fasting). Look for numbers under 130 mg/dL Bring your meter or a log of your numbers to our next visit!  Your goal A1c is below 7.0%  Increase your  physical activity from 2 to 3 or 4 days each week. Move your exercise to after your dinner meals. Look for your fasting numbers to be lower the next morning.  Switch to sugar free beverages as often as you possibly can. Use Splenda to sweeten your coffee, switch to Coke ZERO or Diet sodas, look for sugar-free fruit punch options. Try to drink 3-4 bottles of water per day.  Work on lowering your consumption of fried foods by choosing grilled, baked or broiled meats. If having fried foods, choose smaller portions!!   Expected Outcomes:  Demonstrated interest in learning but significant barriers to change  Education material provided: ADA - How to Thrive: A Guide for Your Journey with Diabetes  If problems or questions, patient to contact team via:  Phone and Email  Future DSME appointment:

## 2023-05-01 ENCOUNTER — Other Ambulatory Visit: Payer: Self-pay | Admitting: Physician Assistant

## 2023-05-01 DIAGNOSIS — F419 Anxiety disorder, unspecified: Secondary | ICD-10-CM

## 2023-05-01 NOTE — Telephone Encounter (Signed)
Requested Prescriptions  Pending Prescriptions Disp Refills   busPIRone (BUSPAR) 5 MG tablet [Pharmacy Med Name: BUSPIRONE HCL 5 MG TABLET] 180 tablet 0    Sig: TAKE 1 TABLET BY MOUTH TWICE A DAY     Psychiatry: Anxiolytics/Hypnotics - Non-controlled Passed - 05/01/2023 12:31 PM      Passed - Valid encounter within last 12 months    Recent Outpatient Visits           1 week ago Anxiety   [facility] [place], [place], PA-C   2 weeks ago Type 2 diabetes mellitus with diabetic neuropathy, without long-term current use of insulin (HCC)   [place] [facility] Malva Limes, MD   1 month ago Anxiety and depression   [facility] [place], [place], PA-C   2 months ago Type 2 diabetes mellitus with diabetic neuropathy, without long-term current use of insulin (HCC)   [place] Malva Limes, MD   7 months ago Type 2 diabetes mellitus with diabetic neuropathy, without long-term current use of insulin (HCC)   [place] [facility] Malva Limes, MD       Future Appointments             In 2 weeks Fisher, Demetrios Isaacs, MD [facility]   In 3 months Fisher, Demetrios Isaacs, MD [facility], [facility]

## 2023-05-02 ENCOUNTER — Ambulatory Visit: Payer: 59 | Admitting: Psychology

## 2023-05-02 DIAGNOSIS — F4323 Adjustment disorder with mixed anxiety and depressed mood: Secondary | ICD-10-CM | POA: Diagnosis not present

## 2023-05-02 NOTE — Progress Notes (Signed)
[facility] Counselor/Therapist Progress Note  Patient ID: Davanta Meuser, MRN: [id number],    Date: 05/02/2023  Time Spent: 50 mins   start time: 1600   end time: 1650  Treatment Type: Individual Therapy  Reported Symptoms: Pt presents for session via Caregility video, granting consent for the session.  Pt states that he is in his vehicle with no one else present and that he understands the limits of virtual sessions.  I shared with pt that I am in my office with no one else here either.    Mental Status Exam: Appearance:  Casual     Behavior: Appropriate  Motor: Normal  Speech/Language:  Clear and Coherent  Affect: Appropriate  Mood: normal  Thought process: normal  Thought content:   WNL  Sensory/Perceptual disturbances:   WNL  Orientation: oriented to person, place, and time/date  Attention: Good  Concentration: Good  Memory: WNL  Fund of knowledge:  Good  Insight:   Good  Judgment:  Good  Impulse Control: Good   Risk Assessment: Danger to Self:  No Self-injurious Behavior: No Danger to Others: No Duty to Warn:no Physical Aggression / Violence:No  Access to Firearms a concern: No  Gang Involvement:No   Subjective: Pt shares that work is going OK "but it has been stressful lately."  Pt is the Designer, 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  He has been in this role for the past 3 yrs and has been with the company for the past 10 yrs.  Pt shares that he met with his PCP since our last session and his BP was a bit high; he has a follow up in 2 wks to check on a medication change they made.  Pt shares that his PCP also referred him to dietician and he met with them earlier this week.  He felt that he learned a lot about his eating and how he could do a better job in choosing foods that are better for him.  Pt shares that most of his stress in his life comes from work.  He has three different bosses at work and his direct boss  has been out of work for a surgery for most of the past year.  Talked with pt about the benefits of self care activities and asked pt to think about which activities he would want to engage in and how he might be able to carve out 30-45 mins daily to engage in them.  We will talk more about this topic at our follow up session in 2 wks.  Interventions: Cognitive Behavioral Therapy  Diagnosis:Adjustment disorder with mixed anxiety and depressed mood  Plan: Treatment Plan Strengths/Abilities:  Intelligent, Intuitive, Willing to participate in therapy Treatment Preferences:  Outpatient Individual Therapy Statement of Needs:  Patient is to use CBT, mindfulness and coping skills to help manage and/or decrease symptoms associated with their diagnosis. Symptoms:  Depressed/Irritable mood, worry, social withdrawal Problems Addressed:  Depressive thoughts, Sadness, Sleep issues, etc. Long Term Goals:  Pt to reduce overall level, frequency, and intensity of the feelings of depression/anxiety as evidenced by decreased irritability, negative self talk, and helpless feelings from 6 to 7 days/week to 0 to 1 days/week, per client report, for at least 3 consecutive months.  Progress: 30% Short Term Goals:  Pt to verbally express understanding of the relationship between feelings of depression/anxiety and their impact on thinking patterns and behaviors.  Pt to verbalize an understanding of the  role that distorted thinking plays in creating fears, excessive worry, and ruminations.  Progress: 30% Target Date:  05/01/2024 Frequency:  Bi-weekly Modality:  Cognitive Behavioral Therapy Interventions by Therapist:  Therapist will use CBT, Mindfulness exercises, Coping skills and Referrals, as needed by client. Client has verbally approved this treatment plan.  Karie Kirks, [facility]

## 2023-05-08 ENCOUNTER — Other Ambulatory Visit: Payer: Self-pay | Admitting: Family Medicine

## 2023-05-09 NOTE — Telephone Encounter (Signed)
Requested Prescriptions  Pending Prescriptions Disp Refills   pravastatin (PRAVACHOL) 80 MG tablet [Pharmacy Med Name: Pravastatin Sodium 80 MG Oral Tablet] 90 tablet 3    Sig: TAKE 1 TABLET BY MOUTH DAILY     Cardiovascular:  Antilipid - Statins Failed - 05/08/2023 10:14 PM      Failed - Lipid Panel in normal range within the last 12 months    Cholesterol, Total  Date Value Ref Range Status  09/06/2022 185 100 - 199 mg/dL Final   LDL Chol Calc (NIH)  Date Value Ref Range Status  09/06/2022 124 (H) 0 - 99 mg/dL Final   HDL  Date Value Ref Range Status  09/06/2022 31 (L) >39 mg/dL Final   Triglycerides  Date Value Ref Range Status  09/06/2022 169 (H) 0 - 149 mg/dL Final         Passed - Patient is not pregnant      Passed - Valid encounter within last 12 months    Recent Outpatient Visits           2 weeks ago Anxiety   [facility] [place], [place], PA-C   4 weeks ago Type 2 diabetes mellitus with diabetic neuropathy, without long-term current use of insulin (HCC)   [place] [facility] Malva Limes, MD   1 month ago Anxiety and depression   [place] [facility] [place], [place], PA-C   3 months ago Type 2 diabetes mellitus with diabetic neuropathy, without long-term current use of insulin (HCC)   [place] [facility] Malva Limes, MD   8 months ago Type 2 diabetes mellitus with diabetic neuropathy, without long-term current use of insulin (HCC)    [facility] Malva Limes, MD       Future Appointments             In 1 week Fisher, Demetrios Isaacs, MD [facility], [facility]   In 3 months Fisher, Demetrios Isaacs, MD [facility], [facility]

## 2023-05-16 ENCOUNTER — Ambulatory Visit (INDEPENDENT_AMBULATORY_CARE_PROVIDER_SITE_OTHER): Payer: 59 | Admitting: Psychology

## 2023-05-16 DIAGNOSIS — F4323 Adjustment disorder with mixed anxiety and depressed mood: Secondary | ICD-10-CM | POA: Diagnosis not present

## 2023-05-16 NOTE — Progress Notes (Signed)
[facility] Counselor/Therapist Progress Note  Patient ID: Douglas Murray, MRN: [id number],    Date: 05/16/2023  Time Spent: 45 mins   start time: 1600   end time: 1645  Treatment Type: Individual Therapy  Reported Symptoms: Pt presents for session via Caregility video, granting consent for the session.  Pt states that he is in his vehicle with no one else present and that he understands the limits of virtual sessions.  I shared with pt that I am in my office with no one else here either.    Mental Status Exam: Appearance:  Casual     Behavior: Appropriate  Motor: Normal  Speech/Language:  Clear and Coherent  Affect: Appropriate  Mood: normal  Thought process: normal  Thought content:   WNL  Sensory/Perceptual disturbances:   WNL  Orientation: oriented to person, place, and time/date  Attention: Good  Concentration: Good  Memory: WNL  Fund of knowledge:  Good  Insight:   Good  Judgment:  Good  Impulse Control: Good   Risk Assessment: Danger to Self:  No Self-injurious Behavior: No Danger to Others: No Duty to Warn:no Physical Aggression / Violence:No  Access to Firearms a concern: No  Gang Involvement:No   Subjective: Pt shares that he has been less anxious since our last session; he has been talking more frequently with his wife and she has been supportive of pt in this time.  Pt shares that he and his family celebrated their son's 13th birthday and had a great time together; there were 45 family members in the group.  Pt continues to take his BP at work and at home and it is better than it was in the doctor's office; he continues to take his BP medication daily.  Pt denies any routine headaches or lightheadedness.  Pt shares that work is going OK; he is trying to be more intentional about leaving work at work when he leaves.  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  Pt had to reschedule his PCP follow up for his BP medication for 2 more weeks.  Pt shares that he enjoys watching movies as a self care activity; he enjoys maintaining his vehicles as well as spending time with his family.  He also enjoys college and professional football and basketball as well.  Pt shares that he has another meeting with the dietician in December.  Pt shares that he tends to snack too much in the evenings while watching TV.  Pt is also trying to incorporate more water into his days and evenings.  Encouraged pt to continue with his self care activities and we will meet for our follow up session in 4 wks.  Interventions: Cognitive Behavioral Therapy  Diagnosis:Adjustment disorder with mixed anxiety and depressed mood  Plan: Treatment Plan Strengths/Abilities:  Intelligent, Intuitive, Willing to participate in therapy Treatment Preferences:  Outpatient Individual Therapy Statement of Needs:  Patient is to use CBT, mindfulness and coping skills to help manage and/or decrease symptoms associated with their diagnosis. Symptoms:  Depressed/Irritable mood, worry, social withdrawal Problems Addressed:  Depressive thoughts, Sadness, Sleep issues, etc. Long Term Goals:  Pt to reduce overall level, frequency, and intensity of the feelings of depression/anxiety as evidenced by decreased irritability, negative self talk, and helpless feelings from 6 to 7 days/week to 0 to 1 days/week, per client report, for at least 3  consecutive months.  Progress: 30% Short Term Goals:  Pt to verbally express understanding of the relationship between feelings of depression/anxiety and their impact on thinking patterns and behaviors.  Pt to verbalize an understanding of the role that distorted thinking plays in creating fears, excessive worry, and ruminations.  Progress: 30% Target Date:  05/01/2024 Frequency:  Bi-weekly Modality:  Cognitive Behavioral Therapy Interventions by Therapist:   Therapist will use CBT, Mindfulness exercises, Coping skills and Referrals, as needed by client. Client has verbally approved this treatment plan.  Karie Kirks, [facility]

## 2023-05-19 ENCOUNTER — Ambulatory Visit: Payer: 59 | Admitting: Family Medicine

## 2023-06-05 ENCOUNTER — Ambulatory Visit: Payer: 59 | Admitting: Dietician

## 2023-06-09 ENCOUNTER — Encounter: Payer: Self-pay | Admitting: Family Medicine

## 2023-06-09 ENCOUNTER — Ambulatory Visit: Payer: 59 | Admitting: Family Medicine

## 2023-06-09 VITALS — BP 125/78 | HR 67 | Resp 16 | Ht 71.0 in | Wt 231.0 lb

## 2023-06-09 DIAGNOSIS — I10 Essential (primary) hypertension: Secondary | ICD-10-CM | POA: Diagnosis not present

## 2023-06-09 DIAGNOSIS — R09A2 Foreign body sensation, throat: Secondary | ICD-10-CM | POA: Diagnosis not present

## 2023-06-09 MED ORDER — FAMOTIDINE 20 MG PO TABS
20.0000 mg | ORAL_TABLET | Freq: Every day | ORAL | 0 refills | Status: AC
Start: 2023-06-09 — End: 2024-05-31

## 2023-06-09 NOTE — Progress Notes (Signed)
Established patient visit   Patient: Douglas Murray   DOB: Jan 26, 1971   52 y.o. Male  MRN: [id number] Visit Date: 06/09/2023  Today's healthcare provider: Mila Merry, MD   Chief Complaint  Patient presents with   Medical Management of Chronic Issues    HTN follow-up. BP readings are 130/80. Reports not starting new BP medicine (Diovan).   Subjective    Discussed the use of AI scribe software for clinical note transcription with the patient, who gave verbal consent to proceed.  History of Present Illness   The patient, with a history of hypertension and diabetes, presented for a routine blood pressure check. He reported a significant improvement in his blood pressure readings since his last visit with Janna in early October, attributing this to dietary changes and increased exercise. Specifically, he reduced his intake of sweets and portion sizes. At home, his blood pressure readings have been around 130/80.  His last A1c was above goal, but no changes were made to his medication regimen. He is currently on Glipizide, Tradjenta, and Jardiance for diabetes management.  The patient also reported a new symptom of a sensation of something being stuck in his throat for the past couple of weeks. He denied any difficulty swallowing but noted the sensation is more noticeable during periods of rest, such as when watching television. He also reported occasional heartburn and a runny nose.       Medications: Outpatient Medications Prior to Visit  Medication Sig Note   atenolol-chlorthalidone (TENORETIC) 50-25 MG tablet TAKE 1 TABLET BY MOUTH DAILY    busPIRone (BUSPAR) 5 MG tablet TAKE 1 TABLET BY MOUTH TWICE A DAY    hydrOXYzine (ATARAX) 50 MG tablet Take 1 tablet (50 mg total) by mouth at bedtime as needed and may repeat dose one time if needed (Insomnia and Anxiety).    JARDIANCE 25 MG TABS tablet TAKE 1 TABLET (25 MG TOTAL) BY MOUTH DAILY.    KLOR-CON M20 20 MEQ tablet  TAKE 1 TABLET BY MOUTH EVERY DAY    metFORMIN (GLUCOPHAGE-XR) 500 MG 24 hr tablet TAKE 2 TABLETS BY MOUTH DAILY  WITH BREAKFAST    pravastatin (PRAVACHOL) 80 MG tablet TAKE 1 TABLET BY MOUTH DAILY    TRADJENTA 5 MG TABS tablet TAKE 1 TABLET (5 MG TOTAL) BY MOUTH DAILY. TAKE IN PLACE OF ONGLYZA    glipiZIDE (GLUCOTROL XL) 5 MG 24 hr tablet Take 1 tablet (5 mg total) by mouth daily with breakfast.    [DISCONTINUED] valsartan (DIOVAN) 40 MG tablet Take 1 tablet (40 mg total) by mouth daily. 06/09/2023: never started   No facility-administered medications prior to visit.   Review of Systems  Constitutional:  Negative for appetite change, chills and fever.  Respiratory:  Negative for chest tightness, shortness of breath and wheezing.   Cardiovascular:  Negative for chest pain and palpitations.  Gastrointestinal:  Negative for abdominal pain, nausea and vomiting.       Objective    BP 125/78 (BP Location: Left Arm, Patient Position: Sitting, Cuff Size: Large)   Pulse 67   Resp 16   Ht 5\' 11"  (1.803 m)   Wt 231 lb (104.8 kg)   BMI 32.22 kg/m   Physical Exam  General Appearance:    Overweight male, alert, cooperative, in no acute distress  HENT:   neck without nodes, throat normal without erythema or exudate, and no neck masses or swelling  Eyes:    PERRL, conjunctiva/corneas clear, EOM's  intact       Lungs:     Clear to auscultation bilaterally, respirations unlabored  Heart:    Normal heart rate. Normal rhythm. No murmurs, rubs, or gallops.    Neurologic:   Awake, alert, oriented x 3. No apparent focal neurological           defect.        Assessment & Plan        Hypertension Improved blood pressure control with lifestyle modifications including diet and exercise. No need for Valsartan at this time. -Continue current lifestyle modifications. -Check blood pressure at home and report any abnormal readings.  Type 2 Diabetes Mellitus Elevated A1c. Patient is on Glipizide,  Tradjenta, and Jardiance. -Continue current medications. -Check A1c at next visit in mid-January.   Globus sensation New onset of sensation of something stuck in throat for a couple of weeks. No difficulty swallowing. Possible causes include sinus drainage or acid reflux. -Prescribe Pepcid for a month to reduce possible acid reflux. -If symptoms persist, refer to an Ear, Nose, and Throat specialist.  General Health Maintenance -Continue current medications. -Flu shot already administered. -Physical scheduled for January 24th.    No follow-ups on file.      Mila Merry, MD  [facility] [phone] (phone) [phone] (fax)  [facility]

## 2023-06-13 ENCOUNTER — Ambulatory Visit (INDEPENDENT_AMBULATORY_CARE_PROVIDER_SITE_OTHER): Payer: 59 | Admitting: Psychology

## 2023-06-13 DIAGNOSIS — F4323 Adjustment disorder with mixed anxiety and depressed mood: Secondary | ICD-10-CM

## 2023-06-13 NOTE — Progress Notes (Signed)
[facility] Counselor/Therapist Progress Note  Patient ID: Douglas Murray, MRN: [id number],    Date: 06/13/2023  Time Spent: 50 mins   start time: 1600   end time: 1650  Treatment Type: Individual Therapy  Reported Symptoms: Pt presents for session via Caregility video, granting consent for the session.  Pt states that he is in his home with no one else present and that he understands the limits of virtual sessions.  I shared with pt that I am in my office with no one else here either.    Mental Status Exam: Appearance:  Casual     Behavior: Appropriate  Motor: Normal  Speech/Language:  Clear and Coherent  Affect: Appropriate  Mood: normal  Thought process: normal  Thought content:   WNL  Sensory/Perceptual disturbances:   WNL  Orientation: oriented to person, place, and time/date  Attention: Good  Concentration: Good  Memory: WNL  Fund of knowledge:  Good  Insight:   Good  Judgment:  Good  Impulse Control: Good   Risk Assessment: Danger to Self:  No Self-injurious Behavior: No Danger to Others: No Duty to Warn:no Physical Aggression / Violence:No  Access to Firearms a concern: No  Gang Involvement:No   Subjective: Pt shares that he has been a bit more anxious since our last session; he shares that they tend to be coming on him when he feels overwhelmed, either at work or at home.  He shares that he has been walking away from the situation in order to collect his thoughts and begin to create a plan to deal with the stressful situation.  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  Pt shares that things at home are good but busy; his son, who just turned 26 yo, is doing well in school.  He is also  in the marching band at school (trombone) and is playing in the [employer] tomorrow.  Pt has been back to the doctor to get his BP checked and the doctor was pleased  with his progress.  Pt shares that he has been taking 30 min walks in his neighborhood 3-4 days per week and he believes that has been good for his BP and as a stress reducer as well.  Pt shares he has been working on the visualization exercise of looking at the door closing as he leaves the plant and being intentional about leaving his work stress behind that closed door.  Pt shares that he was disappointed about the results of the election and is concerned about the direction of the country.  Pt shares that he enjoys watching movies as a self care activity; he enjoys maintaining his vehicles as well as spending time with his family.  He also enjoys college and professional football and basketball as well.  Pt is also trying to incorporate more water into his days and evenings.  Pt shares that he has been sleeping OK; sometimes he has trouble going back to sleep.  Encouraged pt to practice deep breathing as a means of helping him fall back to sleep at night.  Encouraged pt to continue with his self care activities and we will meet for our follow up  session in 2 wks.  Interventions: Cognitive Behavioral Therapy  Diagnosis:Adjustment disorder with mixed anxiety and depressed mood  Plan: Treatment Plan Strengths/Abilities:  Intelligent, Intuitive, Willing to participate in therapy Treatment Preferences:  Outpatient Individual Therapy Statement of Needs:  Patient is to use CBT, mindfulness and coping skills to help manage and/or decrease symptoms associated with their diagnosis. Symptoms:  Depressed/Irritable mood, worry, social withdrawal Problems Addressed:  Depressive thoughts, Sadness, Sleep issues, etc. Long Term Goals:  Pt to reduce overall level, frequency, and intensity of the feelings of depression/anxiety as  evidenced by decreased irritability, negative self talk, and helpless feelings from 6 to 7 days/week to 0 to 1 days/week, per client report, for at least 3 consecutive months.  Progress: 30% Short Term Goals:  Pt to verbally express understanding of the relationship between feelings of depression/anxiety and their impact on thinking patterns and behaviors.  Pt to verbalize an understanding of the role that distorted thinking plays in creating fears, excessive worry, and ruminations.  Progress: 30% Target Date:  05/01/2024 Frequency:  Bi-weekly Modality:  Cognitive Behavioral Therapy Interventions by Therapist:  Therapist will use CBT, Mindfulness exercises, Coping skills and Referrals, as needed by client. Client has verbally approved this treatment plan.  Karie Kirks, [facility]

## 2023-06-25 ENCOUNTER — Ambulatory Visit: Payer: 59 | Admitting: Dietician

## 2023-07-01 ENCOUNTER — Ambulatory Visit: Payer: 59 | Admitting: Psychology

## 2023-07-01 DIAGNOSIS — F4323 Adjustment disorder with mixed anxiety and depressed mood: Secondary | ICD-10-CM

## 2023-07-01 NOTE — Progress Notes (Signed)
[facility] Counselor/Therapist Progress Note  Patient ID: Douglas Murray, MRN: [id number],    Date: 07/01/2023  Time Spent: 50 mins   start time: 1600   end time: 1650  Treatment Type: Individual Therapy  Reported Symptoms: Pt presents for session via Caregility video, granting consent for the session.  Pt states that he is in his home with no one else present and that he understands the limits of virtual sessions.  I shared with pt that I am in my office with no one else here either.    Mental Status Exam: Appearance:  Casual     Behavior: Appropriate  Motor: Normal  Speech/Language:  Clear and Coherent  Affect: Appropriate  Mood: normal  Thought process: normal  Thought content:   WNL  Sensory/Perceptual disturbances:   WNL  Orientation: oriented to person, place, and time/date  Attention: Good  Concentration: Good  Memory: WNL  Fund of knowledge:  Good  Insight:   Good  Judgment:  Good  Impulse Control: Good   Risk Assessment: Danger to Self:  No Self-injurious Behavior: No Danger to Others: No Duty to Warn:no Physical Aggression / Violence:No  Access to Firearms a concern: No  Gang Involvement:No   Subjective: Pt shares that he had a nice Thanksgiving with family in [place]; he also had the day after off as well.  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  Pt shares his anxiety has continued to build a bit more since our last session; he thinks it is mostly due to issues at work.  Asked pt about to think about who he would talk to and what he would ask for if he asked mgmt for help with these tasks.  Pt shares that his sleep is better than it had been; he shares he is falling asleep better; still has some trouble falling back to sleep when he wakes up at night.  Pt has not used deep breathing to help him fall  back to sleep at night; he has been using it to help him deal with stressful situations at work.  Encouraged pt to practice deep breathing to help him fall back to sleep on those nights that he wakes up.  Pt shares that his son (51 yo) and he and his wife all enjoyed being at the [facility]; it was his son's first parade to be in.  Pt has not been able to walk as much lately because of the time change.  Pt shares he has not had a chance to be working on the visualization exercise of looking at the door closing as he leaves the plant and being intentional about leaving his work stress behind that closed door.  Encouraged pt to work on this as he thinks about it.  Pt shares that he enjoys watching movies as a self care activity.  He also enjoys college and professional football and basketball as well.  Pt is also trying to incorporate more water into his days and evenings.  Encouraged pt to continue with his self care activities and we will meet for our follow up session in 2 wks.  Interventions: Cognitive Behavioral Therapy  Diagnosis:Adjustment disorder with mixed anxiety and depressed mood  Plan: Treatment Plan Strengths/Abilities:  Intelligent, Intuitive, Willing to participate in therapy Treatment Preferences:  Outpatient Individual Therapy  Statement of Needs:  Patient is to use CBT, mindfulness and coping skills to help manage and/or decrease symptoms associated with their diagnosis. Symptoms:  Depressed/Irritable mood, worry, social withdrawal Problems Addressed:  Depressive thoughts, Sadness, Sleep issues, etc. Long Term Goals:  Pt to reduce overall level, frequency, and intensity of the feelings of depression/anxiety as evidenced by decreased irritability, negative self talk, and helpless feelings from 6 to 7 days/week to 0 to 1 days/week, per client report, for at least 3 consecutive months.  Progress: 30% Short Term Goals:  Pt to verbally express understanding of the  relationship between feelings of depression/anxiety and their impact on thinking patterns and behaviors.  Pt to verbalize an understanding of the role that distorted thinking plays in creating fears, excessive worry, and ruminations.  Progress: 30% Target Date:  05/01/2024 Frequency:  Bi-weekly Modality:  Cognitive Behavioral Therapy Interventions by Therapist:  Therapist will use CBT, Mindfulness exercises, Coping skills and Referrals, as needed by client. Client has verbally approved this treatment plan.  Karie Kirks, [facility]

## 2023-07-16 ENCOUNTER — Other Ambulatory Visit: Payer: Self-pay | Admitting: Family Medicine

## 2023-07-18 ENCOUNTER — Ambulatory Visit (INDEPENDENT_AMBULATORY_CARE_PROVIDER_SITE_OTHER): Payer: 59 | Admitting: Psychology

## 2023-07-18 DIAGNOSIS — F4323 Adjustment disorder with mixed anxiety and depressed mood: Secondary | ICD-10-CM | POA: Diagnosis not present

## 2023-07-18 NOTE — Progress Notes (Signed)
[facility] Counselor/Therapist Progress Note  Patient ID: Douglas Murray, MRN: [id number],    Date: 07/18/2023  Time Spent: 30 mins   start time: 1000   end time: 1030  Treatment Type: Individual Therapy  Reported Symptoms: Pt presents for session via Caregility video, granting consent for the session.  Pt states that he is in his car with no one else present and that he understands the limits of virtual sessions.  I shared with pt that I am in my office with no one else here either.    Mental Status Exam: Appearance:  Casual     Behavior: Appropriate  Motor: Normal  Speech/Language:  Clear and Coherent  Affect: Appropriate  Mood: normal  Thought process: normal  Thought content:   WNL  Sensory/Perceptual disturbances:   WNL  Orientation: oriented to person, place, and time/date  Attention: Good  Concentration: Good  Memory: WNL  Fund of knowledge:  Good  Insight:   Good  Judgment:  Good  Impulse Control: Good   Risk Assessment: Danger to Self:  No Self-injurious Behavior: No Danger to Others: No Duty to Warn:no Physical Aggression / Violence:No  Access to Firearms a concern: No  Gang Involvement:No   Subjective: Pt shares that he had a nice Christmas with his wife and son and his extended family.  He has been off work since 12/14 and will go back on 1/2.  His wife has been off this week as well.  His wife's birthday is coming up on 1/5 and he is planning to celebrate with friends and family for that as well.  Pt shares that he has been working on chores around the house this week (working on his lawnmower and cleaning out his storage building).  Pt shares he has continued to sleep well while on his vacation; he is allowing himself to sleep in a bit while on vacation.  Pt and his wife are going after Christmas shopping and will plan to get some lunch while they are out today; they went to the movies yesterday and saw "[place]."  Pt shares that he  continues to enjoy watching movies as a self care activity.  He also enjoys college and professional football and basketball as well.  Pt is also trying to incorporate more water into his days and evenings, even during his time off from work.  Encouraged pt to continue with his self care activities and we will meet for our follow up session in 2 wks.  Interventions: Cognitive Behavioral Therapy  Diagnosis:Adjustment disorder with mixed anxiety and depressed mood  Plan: Treatment Plan Strengths/Abilities:  Intelligent, Intuitive, Willing to participate in therapy Treatment Preferences:  Outpatient Individual Therapy Statement of Needs:  Patient is to use CBT, mindfulness and coping skills to help manage and/or decrease symptoms associated with their diagnosis. Symptoms:  Depressed/Irritable mood, worry, social withdrawal Problems Addressed:  Depressive thoughts, Sadness, Sleep issues, etc. Long Term Goals:  Pt to reduce overall level, frequency, and intensity of the feelings of depression/anxiety as evidenced by decreased irritability, negative self talk, and helpless feelings from 6 to 7 days/week to 0 to 1 days/week, per client report, for at least 3 consecutive months.  Progress: 30% Short Term Goals:  Pt to verbally express understanding of the relationship between feelings of depression/anxiety and their impact on thinking patterns and behaviors.  Pt to verbalize an understanding of the role that distorted thinking plays in creating fears, excessive worry, and ruminations.  Progress: 30% Target Date:  05/01/2024 Frequency:  Bi-weekly Modality:  Cognitive Behavioral Therapy Interventions by Therapist:  Therapist will use CBT, Mindfulness exercises, Coping skills and Referrals, as needed by client. Client has verbally approved this treatment plan.  Karie Kirks, [facility]

## 2023-07-29 ENCOUNTER — Ambulatory Visit: Payer: 59 | Admitting: Psychology

## 2023-07-29 DIAGNOSIS — F4323 Adjustment disorder with mixed anxiety and depressed mood: Secondary | ICD-10-CM | POA: Diagnosis not present

## 2023-07-29 NOTE — Progress Notes (Signed)
 [facility] Counselor/Therapist Progress Note  Patient ID: Kelcey Wickstrom, MRN: [id number],    Date: 07/29/2023  Time Spent: 45 mins   start time: 1700   end time: 1745  Treatment Type: Individual Therapy  Reported Symptoms: Pt presents for session via Caregility video, granting consent for the session.  Pt states that he is in his car with no one else present and that he understands the limits of virtual sessions.  I shared with pt that I am in my office with no one else here either.    Mental Status Exam: Appearance:  Casual     Behavior: Appropriate  Motor: Normal  Speech/Language:  Clear and Coherent  Affect: Appropriate  Mood: normal  Thought process: normal  Thought content:   WNL  Sensory/Perceptual disturbances:   WNL  Orientation: oriented to person, place, and time/date  Attention: Good  Concentration: Good  Memory: WNL  Fund of knowledge:  Good  Insight:   Good  Judgment:  Good  Impulse Control: Good   Risk Assessment: Danger to Self:  No Self-injurious Behavior: No Danger to Others: No Duty to Warn:no Physical Aggression / Violence:No  Access to Firearms a concern: No  Gang Involvement:No   Subjective: Pt shares that he had a nice New Year's celebration with his wife and son and his extended family.  He has been back at work since 1/2; he was overwhelmed last week but is feeling more like himself this week.  His wife has returned to work as well and his son returned to school today; they were out yesterday because of the snow.  Pt shares that he helped his wife celebrate her birthday on Sunday with friends and family.  Pt shares he enjoyed being off from work for the time over the holidays and appreciated being able to get done things at home.  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  Pt shares  that he did not enjoy the paperwork required for the Safety position.  He shares that his pay did not change with this re-assignment and he is thankful for that.  The Safety program is now in HR with their company.  He is focusing on the Facilities side of his responsibilities and wants to work on feeling OK with this change; he does now have a new group of managers and supervisors that he is having to get to know.  Pt shares that he continues to enjoy watching movies as a self care activity.  He also enjoys college and professional football and basketball as well.  Encouraged pt to continue with his self care activities and we will meet for our follow up session in 2 wks.  Interventions: Cognitive Behavioral Therapy  Diagnosis:Adjustment disorder with mixed anxiety and depressed mood  Plan: Treatment Plan Strengths/Abilities:  Intelligent, Intuitive, Willing to participate in therapy Treatment Preferences:  Outpatient Individual Therapy Statement of Needs:  Patient is to use CBT, mindfulness and coping skills to help manage and/or decrease symptoms associated with their diagnosis. Symptoms:  Depressed/Irritable mood, worry, social withdrawal Problems Addressed:  Depressive thoughts, Sadness, Sleep issues, etc. Long Term Goals:  Pt to reduce overall level, frequency, and intensity of the feelings of depression/anxiety as evidenced by decreased irritability, negative self talk, and helpless feelings from 6 to 7 days/week to 0 to 1  days/week, per client report, for at least 3 consecutive months.  Progress: 30% Short Term Goals:  Pt to verbally express understanding of the relationship between feelings of depression/anxiety and their impact on thinking patterns and behaviors.  Pt to verbalize an understanding of the role that distorted thinking plays in creating fears, excessive worry, and ruminations.  Progress: 30% Target Date:  05/01/2024 Frequency:  Bi-weekly Modality:  Cognitive Behavioral  Therapy Interventions by Therapist:  Therapist will use CBT, Mindfulness exercises, Coping skills and Referrals, as needed by client. Client has verbally approved this treatment plan.  Francis KATHEE Macintosh, [facility]

## 2023-08-12 ENCOUNTER — Ambulatory Visit: Payer: 59 | Admitting: Psychology

## 2023-08-12 DIAGNOSIS — F4323 Adjustment disorder with mixed anxiety and depressed mood: Secondary | ICD-10-CM

## 2023-08-12 NOTE — Progress Notes (Signed)
[facility] Counselor/Therapist Progress Note  Patient ID: Douglas Murray, MRN: [id number],    Date: 08/12/2023  Time Spent: 45 mins   start time: 1700   end time: 1745  Treatment Type: Individual Therapy  Reported Symptoms: Pt presents for session via Caregility video, granting consent for the session.  Pt states that he is in his home with no one else present and that he understands the limits of virtual sessions.  I shared with pt that I am in my office with no one else here either.    Mental Status Exam: Appearance:  Casual     Behavior: Appropriate  Motor: Normal  Speech/Language:  Clear and Coherent  Affect: Appropriate  Mood: normal  Thought process: normal  Thought content:   WNL  Sensory/Perceptual disturbances:   WNL  Orientation: oriented to person, place, and time/date  Attention: Good  Concentration: Good  Memory: WNL  Fund of knowledge:  Good  Insight:   Good  Judgment:  Good  Impulse Control: Good   Risk Assessment: Danger to Self:  No Self-injurious Behavior: No Danger to Others: No Duty to Warn:no Physical Aggression / Violence:No  Access to Firearms a concern: No  Gang Involvement:No   Subjective: Pt shares that he "has been doing alright since our last session.  I am still having a little trouble getting used to the change at work."  Encouraged pt to choose to look at this change from the positive perspective that the company chose to keep him and chose this new role specifically for him because he is valuable to them in this role.  Pt shares that his wife and son are doing well; his son did not make the [employer] basketball team but might want to play basketball at the [employer] this year.  His wife is doing fine as well.  Pt shares that he and his family went to their first [place] [employer] game yesterday and they all had a nice time there.  Pt shares his son made straight A's on his report card for the first semester of the year.  Pt shares that he is  still trying to feel his way through his new duties at work but he feels like he is getting more comfortable with the new duties.  Pt shares that he continues to enjoy watching movies as a self care activity.  He also enjoys college and professional football and basketball as well.  Pt shares that his boss' job has been posted on [employer] and he wonders why it was not posted internally.  Encouraged pt to go ask HR tomorrow why it wasn't posted internally; pt may want to apply for it himself.  Encouraged pt to continue with his self care activities and we will meet for our follow up session in 2 wks.  Interventions: Cognitive Behavioral Therapy  Diagnosis:Adjustment disorder with mixed anxiety and depressed mood  Plan: Treatment Plan Strengths/Abilities:  Intelligent, Intuitive, Willing to participate in therapy Treatment Preferences:  Outpatient Individual Therapy Statement of Needs:  Patient is to use CBT, mindfulness and coping skills to help manage and/or decrease symptoms associated with their diagnosis. Symptoms:  Depressed/Irritable mood, worry, social withdrawal Problems Addressed:  Depressive thoughts, Sadness, Sleep issues, etc. Long Term Goals:  Pt to reduce overall level, frequency, and intensity of the feelings of depression/anxiety as evidenced by decreased irritability, negative self talk, and helpless feelings from 6 to 7 days/week to 0 to 1 days/week, per client report, for at least 3 consecutive  months.  Progress: 30% Short Term Goals:  Pt to verbally express understanding of the relationship between feelings of depression/anxiety and their impact on thinking patterns and behaviors.  Pt to verbalize an understanding of the role that distorted thinking plays in creating fears, excessive worry, and ruminations.  Progress: 30% Target Date:  05/01/2024 Frequency:  Bi-weekly Modality:  Cognitive Behavioral Therapy Interventions by Therapist:  Therapist will use CBT, Mindfulness exercises,  Coping skills and Referrals, as needed by client. Client has verbally approved this treatment plan.  Karie Kirks, [facility]

## 2023-08-15 ENCOUNTER — Ambulatory Visit (INDEPENDENT_AMBULATORY_CARE_PROVIDER_SITE_OTHER): Payer: 59 | Admitting: Family Medicine

## 2023-08-15 VITALS — BP 135/77 | HR 70 | Temp 98.2°F | Resp 16 | Ht 71.0 in | Wt 226.5 lb

## 2023-08-15 DIAGNOSIS — Z0001 Encounter for general adult medical examination with abnormal findings: Secondary | ICD-10-CM

## 2023-08-15 DIAGNOSIS — Z0000 Encounter for general adult medical examination without abnormal findings: Secondary | ICD-10-CM

## 2023-08-15 DIAGNOSIS — F419 Anxiety disorder, unspecified: Secondary | ICD-10-CM

## 2023-08-15 DIAGNOSIS — F32A Depression, unspecified: Secondary | ICD-10-CM

## 2023-08-15 DIAGNOSIS — E1140 Type 2 diabetes mellitus with diabetic neuropathy, unspecified: Secondary | ICD-10-CM

## 2023-08-15 DIAGNOSIS — R09A2 Foreign body sensation, throat: Secondary | ICD-10-CM

## 2023-08-15 DIAGNOSIS — E881 Lipodystrophy, not elsewhere classified: Secondary | ICD-10-CM

## 2023-08-15 DIAGNOSIS — I10 Essential (primary) hypertension: Secondary | ICD-10-CM

## 2023-08-15 MED ORDER — OMEPRAZOLE 20 MG PO CPDR: 20.0000 mg | DELAYED_RELEASE_CAPSULE | Freq: Every day | ORAL | 3 refills | Status: AC

## 2023-08-20 NOTE — Progress Notes (Signed)
Complete physical exam   Patient: Douglas Murray   DOB: 03/05/1971   53 y.o. Male  MRN: [id number] Visit Date: 08/15/2023  Today's healthcare provider: Mila Merry, MD   Chief Complaint  Patient presents with   Annual Exam   Subjective    Discussed the use of AI scribe software for clinical note transcription with the patient, who gave verbal consent to proceed.  History of Present Illness   The patient, with a history of diabetes, hypertension, and hypertriglyceridemia, presents for a routine physical. He reports good overall health with occasional lapses in dietary adherence. His blood glucose levels are generally well-controlled, averaging around 130-135 mg/dL. He reports intermittent diarrhea, which he attributes to his metformin regimen.  The patient also monitors his blood pressure regularly, which he reports as being within normal limits. He expresses willingness to return for fasting labs, as his triglycerides have been noted to run high in the past.  In addition to his chronic conditions, the patient reports a sensation of 'popping' in his ears, which he attributes to sinus congestion. He also describes a sensation of something being 'stuck' in his throat, which occurs intermittently and not necessarily related to eating. He also reports occasional feelings of burning, suggestive of gas reflux. He denies any history of smoking.         Past Medical History:  Diagnosis Date   Allergy    Cholelithiasis 2001   Identified on ultrasound. Asymptomatic.   Colon polyp 2010   Past Surgical History:  Procedure Laterality Date   COLONOSCOPY  10/11/13   Dr Lemar Livings   WISDOM TOOTH EXTRACTION  1996   Social History   Socioeconomic History   Marital status: Married    Spouse name: Not on file   Number of children: Not on file   Years of education: Not on file   Highest education level: Associate degree: academic program  Occupational History   Not on file   Tobacco Use   Smoking status: Never   Smokeless tobacco: Never  Vaping Use   Vaping status: Never Used  Substance and Sexual Activity   Alcohol use: No   Drug use: No   Sexual activity: Not on file  Other Topics Concern   Not on file  Social History Narrative   Not on file   Social Drivers of Health   Financial Resource Strain: Low Risk  (06/08/2023)   Overall Financial Resource Strain (CARDIA)    Difficulty of Paying Living Expenses: Not very hard  Food Insecurity: Food Insecurity Present (06/08/2023)   Hunger Vital Sign    Worried About Running Out of Food in the Last Year: Sometimes true    Ran Out of Food in the Last Year: Never true  Transportation Needs: No Transportation Needs (06/08/2023)   PRAPARE - Administrator, Civil Service (Medical): No    Lack of Transportation (Non-Medical): No  Physical Activity: Insufficiently Active (06/08/2023)   Exercise Vital Sign    Days of Exercise per Week: 2 days    Minutes of Exercise per Session: 30 min  Stress: Stress Concern Present (06/08/2023)   [employer] of Occupational Health - Occupational Stress Questionnaire    Feeling of Stress : Rather much  Social Connections: Socially Integrated (06/08/2023)   Social Connection and Isolation Panel [NHANES]    Frequency of Communication with Friends and Family: Twice a week    Frequency of Social Gatherings with Friends and Family: Once a week  Attends Religious Services: More than 4 times per year    Active Member of Clubs or Organizations: Yes    Attends Engineer, structural: More than 4 times per year    Marital Status: Married  Catering manager Violence: Not on file   Family Status  Relation Name Status   Mother  Alive   Father  Alive   Sister  Alive   Mat Aunt  Alive  No partnership data on file   Family History  Problem Relation Age of Onset   Diabetes Mother    Cancer Maternal Aunt    No Known Allergies  Patient Care  Team: Malva Limes, MD as PCP - General (Family Medicine) Lemar Livings, Merrily Pew, MD (General Surgery) [facility], [place]   Medications: Outpatient Medications Prior to Visit  Medication Sig   atenolol-chlorthalidone (TENORETIC) 50-25 MG tablet TAKE 1 TABLET BY MOUTH DAILY   busPIRone (BUSPAR) 5 MG tablet TAKE 1 TABLET BY MOUTH TWICE A DAY   famotidine (PEPCID) 20 MG tablet Take 1 tablet (20 mg total) by mouth daily.   glipiZIDE (GLUCOTROL XL) 5 MG 24 hr tablet Take 1 tablet (5 mg total) by mouth daily with breakfast.   hydrOXYzine (ATARAX) 50 MG tablet Take 1 tablet (50 mg total) by mouth at bedtime as needed and may repeat dose one time if needed (Insomnia and Anxiety).   JARDIANCE 25 MG TABS tablet TAKE 1 TABLET (25 MG TOTAL) BY MOUTH DAILY.   KLOR-CON M20 20 MEQ tablet TAKE 1 TABLET BY MOUTH EVERY DAY   metFORMIN (GLUCOPHAGE-XR) 500 MG 24 hr tablet TAKE 2 TABLETS BY MOUTH DAILY  WITH BREAKFAST   pravastatin (PRAVACHOL) 80 MG tablet TAKE 1 TABLET BY MOUTH DAILY   TRADJENTA 5 MG TABS tablet TAKE 1 TABLET (5 MG TOTAL) BY MOUTH DAILY. TAKE IN PLACE OF ONGLYZA   No facility-administered medications prior to visit.    Review of Systems  Constitutional:  Negative for chills, diaphoresis and fever.  HENT:  Negative for congestion, ear discharge, ear pain, hearing loss, nosebleeds, sore throat and tinnitus.   Eyes:  Negative for photophobia, pain, discharge and redness.  Respiratory:  Negative for cough, shortness of breath, wheezing and stridor.   Cardiovascular:  Negative for chest pain, palpitations and leg swelling.  Gastrointestinal:  Negative for abdominal pain, blood in stool, constipation, diarrhea, nausea and vomiting.  Endocrine: Negative for polydipsia.  Genitourinary:  Negative for dysuria, flank pain, frequency, hematuria and urgency.  Musculoskeletal:  Negative for back pain, myalgias and neck pain.  Skin:  Negative for rash.  Allergic/Immunologic:  Negative for environmental allergies.  Neurological:  Negative for dizziness, tremors, seizures, weakness and headaches.  Hematological:  Does not bruise/bleed easily.  Psychiatric/Behavioral:  Negative for hallucinations and suicidal ideas. The patient is not nervous/anxious.      Objective    BP 135/77 (BP Location: Left Arm, Patient Position: Sitting, Cuff Size: Normal)   Pulse 70   Temp 98.2 F (36.8 C)   Resp 16   Ht 5\' 11"  (1.803 m)   Wt 226 lb 8 oz (102.7 kg)   SpO2 96%   BMI 31.59 kg/m    Physical Exam  General Appearance:    Well developed, well nourished male. Alert, cooperative, in no acute distress, appears stated age  Head:    Normocephalic, without obvious abnormality, atraumatic  Eyes:    PERRL, conjunctiva/corneas clear, EOM's intact, fundi    benign, both eyes  Ears:    Normal TM's and external ear canals, both ears  Nose:   Nares normal, septum midline, mucosa normal, no drainage   or sinus tenderness  Throat:   Lips, mucosa, and tongue normal; teeth and gums normal  Neck:   Supple, symmetrical, trachea midline, no adenopathy;       thyroid:  No enlargement/tenderness/nodules; no carotid   bruit or JVD  Back:     Symmetric, no curvature, ROM normal, no CVA tenderness  Lungs:     Clear to auscultation bilaterally, respirations unlabored  Chest wall:    No tenderness or deformity  Heart:    Normal heart rate. Normal rhythm. No murmurs, rubs, or gallops.  S1 and S2 normal  Abdomen:     Soft, non-tender, bowel sounds active all four quadrants,    no masses, no organomegaly  Genitalia:    deferred  Rectal:    deferred  Extremities:   All extremities are intact. No cyanosis or edema  Pulses:   2+ and symmetric all extremities  Skin:   Skin color, texture, turgor normal, no rashes or lesions  Lymph nodes:   Cervical, supraclavicular, and axillary nodes normal  Neurologic:   CNII-XII intact. Normal strength, sensation and reflexes      throughout        Last depression screening scores    08/15/2023    3:30 PM 06/09/2023    3:59 PM 04/29/2023    2:09 PM  PHQ 2/9 Scores  PHQ - 2 Score 3 5 0  PHQ- 9 Score 9 12    Last fall risk screening    04/29/2023    2:09 PM  Fall Risk   Falls in the past year? 0   Last Audit-C alcohol use screening    06/08/2023    7:04 PM  Alcohol Use Disorder Test (AUDIT)  1. How often do you have a drink containing alcohol? 0  2. How many drinks containing alcohol do you have on a typical day when you are drinking? 0  3. How often do you have six or more drinks on one occasion? 0  AUDIT-C Score 0      Patient-reported   A score of 3 or more in women, and 4 or more in men indicates increased risk for alcohol abuse, EXCEPT if all of the points are from question 1     Assessment & Plan    Routine Health Maintenance and Physical Exam  Exercise Activities and Dietary recommendations  Goals   None     Immunization History  Administered Date(s) Administered   Influenza, Seasonal, Injecte, Preservative Fre 03/26/2023   Influenza,inj,Quad PF,6+ Mos 07/10/2015, 05/09/2017, 05/19/2018, 06/21/2019, 06/26/2020, 05/14/2021, 05/17/2022   Pneumococcal Polysaccharide-23 06/02/2013   Td 11/17/2017   Tdap 10/23/2007    Health Maintenance  Topic Date Due   Pneumococcal Vaccine 8-71 Years old (2 of 2 - PCV) 06/02/2014   Zoster Vaccines- Shingrix (1 of 2) Never done   COVID-19 Vaccine (1 - 2024-25 season) Never done   Colonoscopy  10/13/2023   HEMOGLOBIN A1C  10/09/2023   OPHTHALMOLOGY EXAM  11/05/2023   Diabetic kidney evaluation - eGFR measurement  02/07/2024   Diabetic kidney evaluation - Urine ACR  03/25/2024   DTaP/Tdap/Td (3 - Td or Tdap) 11/18/2027   INFLUENZA VACCINE  Completed   Hepatitis C Screening  Completed   HIV Screening  Completed   HPV VACCINES  Aged Out    Discussed health benefits of physical activity,  and encouraged him to engage in regular exercise appropriate for his age and  condition.  Recommended Shingles vaccine today which he declined.     Type 2 Diabetes Mellitus Blood glucose levels generally around 130-135. Occasional diarrhea likely secondary to Metformin. -Continue current management.  Hypertension -Well controlled.  Continue current medications.    Hypertriglyceridemia Non-fasting state today, unable to perform labs. -Return to lab for fasting blood work including lipid panel and HbA1c.  Gastroesophageal Reflux Disease (GERD) Reports sensation of something stuck in throat and occasional burning sensation suggestive of acid reflux. -Start Omeprazole and reassess in one month. If no improvement, consider referral to ENT.   Reports popping in ears and stuffiness, likely secondary to dry sinuses. -Consider use of humidifier or saline spray to moisturize sinuses.         Mila Merry, MD  [facility] [phone] (phone) [phone] (fax)  [facility]

## 2023-08-29 ENCOUNTER — Ambulatory Visit (INDEPENDENT_AMBULATORY_CARE_PROVIDER_SITE_OTHER): Payer: 59 | Admitting: Psychology

## 2023-08-29 DIAGNOSIS — F4323 Adjustment disorder with mixed anxiety and depressed mood: Secondary | ICD-10-CM | POA: Diagnosis not present

## 2023-08-29 NOTE — Progress Notes (Signed)
 [facility] Counselor/Therapist Progress Note  Patient ID: Douglas Murray, MRN: [id number],    Date: 08/29/2023  Time Spent: 30 mins   start time: 1600   end time: 1630  Treatment Type: Individual Therapy  Reported Symptoms: Pt presents for session via Caregility video, granting consent for the session.  Pt states that he is in his car with no one else present and that he understands the limits of virtual sessions.  I shared with pt that I am in my office with no one else here either.    Mental Status Exam: Appearance:  Casual     Behavior: Appropriate  Motor: Normal  Speech/Language:  Clear and Coherent  Affect: Appropriate  Mood: normal  Thought process: normal  Thought content:   WNL  Sensory/Perceptual disturbances:   WNL  Orientation: oriented to person, place, and time/date  Attention: Good  Concentration: Good  Memory: WNL  Fund of knowledge:  Good  Insight:   Good  Judgment:  Good  Impulse Control: Good   Risk Assessment: Danger to Self:  No Self-injurious Behavior: No Danger to Others: No Duty to Warn:no Physical Aggression / Violence:No  Access to Firearms a concern: No  Gang Involvement:No   Subjective: Pt shares that he has been doing well since our last session.  Pt shares that work is going pretty well.  Generally he feels he has enough time to get his work done.  Pt shares his son is doing well in school and his wife is doing well at work; they are going to start working 6 days per week this week and will be doing it for a couple of months.  She works second shift as well. Pt and his wife have been married for 22 yrs and he is very pleased with his marriage.  Pt and his family (wife, son, aunt and mom) always go to [place] for vacation at the end of summer.  Several family members want to go on a cruise sometime this year as well and pt would like to be able to do that.  Pt continues to watch the NFL playoffs and will watch the Super Bowl at  his home.  He has also been watching college and professional basketball.  Pt continues to adjust to his new job duties at work and it is going OK; he tries not to worry about the change and just tries to focus on the positive of his current duties; he feels better about getting his current responsibilities accomplished in his new role.  Pt shares that he has been sleeping pretty well; it takes him a while to fall asleep; he uses white noise to help him relax and fall asleep.  Encouraged pt to continue with his self care activities and we will meet for our follow up session in 2 wks.  Interventions: Cognitive Behavioral Therapy  Diagnosis:Adjustment disorder with mixed anxiety and depressed mood  Plan: Treatment Plan Strengths/Abilities:  Intelligent, Intuitive, Willing to participate in therapy Treatment Preferences:  Outpatient Individual Therapy Statement of Needs:  Patient is to use CBT, mindfulness and coping skills to help manage and/or decrease symptoms associated with their diagnosis. Symptoms:  Depressed/Irritable mood, worry, social withdrawal Problems Addressed:  Depressive thoughts, Sadness, Sleep issues, etc. Long Term Goals:  Pt to reduce overall level, frequency, and intensity of the feelings of depression/anxiety as evidenced by decreased irritability, negative self talk, and helpless feelings from 6 to 7 days/week to 0 to 1 days/week, per client report, for  at least 3 consecutive months.  Progress: 30% Short Term Goals:  Pt to verbally express understanding of the relationship between feelings of depression/anxiety and their impact on thinking patterns and behaviors.  Pt to verbalize an understanding of the role that distorted thinking plays in creating fears, excessive worry, and ruminations.  Progress: 30% Target Date:  05/01/2024 Frequency:  Bi-weekly Modality:  Cognitive Behavioral Therapy Interventions by Therapist:  Therapist will use CBT, Mindfulness exercises, Coping skills  and Referrals, as needed by client. Client has verbally approved this treatment plan.  Francis KATHEE Macintosh, [facility]

## 2023-09-02 ENCOUNTER — Encounter: Payer: Self-pay | Admitting: Family Medicine

## 2023-09-02 ENCOUNTER — Other Ambulatory Visit: Payer: Self-pay | Admitting: Family Medicine

## 2023-09-02 DIAGNOSIS — E1140 Type 2 diabetes mellitus with diabetic neuropathy, unspecified: Secondary | ICD-10-CM

## 2023-09-02 LAB — LIPID PANEL
Chol/HDL Ratio: 5.9 {ratio} — ABNORMAL HIGH (ref 0.0–5.0)
Cholesterol, Total: 171 mg/dL (ref 100–199)
HDL: 29 mg/dL — ABNORMAL LOW (ref >39)
LDL Chol Calc (NIH): 115 mg/dL — ABNORMAL HIGH (ref 0–99)
Triglycerides: 148 mg/dL (ref 0–149)
VLDL Cholesterol Cal: 27 mg/dL (ref 5–40)

## 2023-09-02 LAB — COMPREHENSIVE METABOLIC PANEL
ALT: 33 [IU]/L (ref 0–44)
AST: 19 [IU]/L (ref 0–40)
Albumin: 4.4 g/dL (ref 3.8–4.9)
Alkaline Phosphatase: 99 [IU]/L (ref 44–121)
BUN/Creatinine Ratio: 8 — ABNORMAL LOW (ref 9–20)
BUN: 9 mg/dL (ref 6–24)
Bilirubin Total: 0.4 mg/dL (ref 0.0–1.2)
CO2: 26 mmol/L (ref 20–29)
Calcium: 9.4 mg/dL (ref 8.7–10.2)
Chloride: 100 mmol/L (ref 96–106)
Creatinine, Ser: 1.11 mg/dL (ref 0.76–1.27)
Globulin, Total: 2.7 g/dL (ref 1.5–4.5)
Glucose: 225 mg/dL — ABNORMAL HIGH (ref 70–99)
Potassium: 3.7 mmol/L (ref 3.5–5.2)
Sodium: 145 mmol/L — ABNORMAL HIGH (ref 134–144)
Total Protein: 7.1 g/dL (ref 6.0–8.5)
eGFR: 80 mL/min/{1.73_m2} (ref >59)

## 2023-09-02 LAB — CBC
Hematocrit: 47.2 % (ref 37.5–51.0)
Hemoglobin: 15.9 g/dL (ref 13.0–17.7)
MCH: 28.9 pg (ref 26.6–33.0)
MCHC: 33.7 g/dL (ref 31.5–35.7)
MCV: 86 fL (ref 79–97)
Platelets: 234 10*3/uL (ref 150–450)
RBC: 5.5 x10E6/uL (ref 4.14–5.80)
RDW: 13.2 % (ref 11.6–15.4)
WBC: 6.8 10*3/uL (ref 3.4–10.8)

## 2023-09-02 LAB — HEMOGLOBIN A1C
Est. average glucose Bld gHb Est-mCnc: 229 mg/dL
Hgb A1c MFr Bld: 9.6 % — ABNORMAL HIGH (ref 4.8–5.6)

## 2023-09-02 MED ORDER — GLIPIZIDE ER 10 MG PO TB24: 10.0000 mg | ORAL_TABLET | Freq: Every day | ORAL | 1 refills | Status: DC

## 2023-09-16 ENCOUNTER — Telehealth: Payer: Self-pay | Admitting: Family Medicine

## 2023-09-16 ENCOUNTER — Telehealth: Payer: Self-pay

## 2023-09-16 NOTE — Telephone Encounter (Signed)
 Pt called, but the phone line was disconnected. Returned the call, but was only able to leave a voice message, asking the pt to contact the office back regarding his question about his lab results.

## 2023-09-16 NOTE — Telephone Encounter (Signed)
 LMTCB-Ok for [facility] Nurse to give message when patient calls back.

## 2023-09-16 NOTE — Telephone Encounter (Signed)
 Copied from CRM [id number]. Topic: Clinical - Lab/Test Results >> Sep 16, 2023 11:00 AM Ivette P wrote: Reason for CRM: Pt called back for results. Pt would like someone to reach out to him to explain results. Pt is at work and could not wait on hold  Pt requsting callback [phone]

## 2023-09-16 NOTE — Telephone Encounter (Signed)
 Patient was sent mychart message on 2/10 that he needs to increase glipizide dose to 10mg , but it doesn't look like he has viewed message or picked up prescription. Please let patient he needs to increase glipizide to 10mg  and schedule a follow up appointment in May. He can either take 2 of his 5mg  tablets every morning, or resend prescription for 10mg  tablets.

## 2023-09-17 ENCOUNTER — Ambulatory Visit: Payer: Self-pay | Admitting: Family Medicine

## 2023-09-17 NOTE — Telephone Encounter (Signed)
 Called patient back No answer Left message. Ok for [facility] to give patient provider's message.  Note Patient was sent mychart message on 2/10 that he needs to increase glipizide dose to 10mg , but it doesn't look like he has viewed message or picked up prescription. Please let patient he needs to increase glipizide to 10mg  and schedule a follow up appointment in May. He can either take 2 of his 5mg  tablets every morning, or resend prescription for 10mg  tablets.

## 2023-09-17 NOTE — Telephone Encounter (Signed)
 Info only: pt returning call from office regarding labs. RN read Dr Douglas Murray message to pt. Pt verbalized understanding and did not have any questions. Pt is going to call pharmacy to make sure medication is still available since script was on 2/11.          Copied from CRM [id number]. Topic: Clinical - Lab/Test Results >> Sep 16, 2023 11:00 AM Douglas Murray wrote: Reason for CRM: Pt called back for results. Pt would like someone to reach out to him to explain results. Pt is at work and could not wait on hold  Pt requsting callback [phone] Reason for Disposition  Health Information question, no triage required and triager able to answer question  Answer Assessment - Initial Assessment Questions 1. REASON FOR CALL or QUESTION: "What is your reason for calling today?" or "How can I best help you?" or "What question do you have that I can help answer?"     Pt had message to call office to go over labs.  Protocols used: Information Only Call - No Triage-A-AH

## 2023-09-17 NOTE — Telephone Encounter (Signed)
 Duplicate encounter

## 2023-09-19 ENCOUNTER — Ambulatory Visit: Payer: 59 | Admitting: Psychology

## 2023-09-19 DIAGNOSIS — F4323 Adjustment disorder with mixed anxiety and depressed mood: Secondary | ICD-10-CM | POA: Diagnosis not present

## 2023-09-19 NOTE — Progress Notes (Signed)
 [facility] Counselor/Therapist Progress Note  Patient ID: Tyquon Near, MRN: [id number],    Date: 09/19/2023  Time Spent: 45 mins   start time: 1600   end time: 1645  Treatment Type: Individual Therapy  Reported Symptoms: Pt presents for session via Caregility video, granting consent for the session.  Pt states that he is in his car with no one else present and that he understands the limits of virtual sessions.  I shared with pt that I am in my office with no one else here either.    Mental Status Exam: Appearance:  Casual     Behavior: Appropriate  Motor: Normal  Speech/Language:  Clear and Coherent  Affect: Appropriate  Mood: normal  Thought process: normal  Thought content:   WNL  Sensory/Perceptual disturbances:   WNL  Orientation: oriented to person, place, and time/date  Attention: Good  Concentration: Good  Memory: WNL  Fund of knowledge:  Good  Insight:   Good  Judgment:  Good  Impulse Control: Good   Risk Assessment: Danger to Self:  No Self-injurious Behavior: No Danger to Others: No Duty to Warn:no Physical Aggression / Violence:No  Access to Firearms a concern: No  Gang Involvement:No   Subjective: Pt shares that he "has been doing fine since our last session."  Pt shares that work has been stressful lately; he did talk to HR about his boss' job being posted on Indeed and not internally.  He did apply for the position previously and was told that he did not qualify for the position.  That message was hurtful to pt and he disagreed with them.  Pt is not actively looking for another job but he would be open to other opportunities.  Pt has talked with his wife about these situations and that is unusual for him; "I normally keep this kind of thing bottled up inside me but it felt good to talk with her about how I was feeling."  Pt shares that his wife is now working 7 days per week and that is hard for her; she is still working second shift.   Pt shares that he wanted [place] to win the Super Bowl and so he was pleased with the outcome of the game.  Pt shares he does not have strong feelings about what is going on in our country right now.  He wishes the politicians could work better together.   Pt shares that he continues to watch sports on TV; his son is going with his school class to the [employer] tournament in [place].  Pt shares that he has been sleeping pretty well; he uses white noise to help him relax and fall asleep and it is starting to work really well for him; his wife likes it too.  Encouraged pt to continue with his self care activities and we will meet for our follow up session in 2 wks.  Interventions: Cognitive Behavioral Therapy  Diagnosis:Adjustment disorder with mixed anxiety and depressed mood  Plan: Treatment Plan Strengths/Abilities:  Intelligent, Intuitive, Willing to participate in therapy Treatment Preferences:  Outpatient Individual Therapy Statement of Needs:  Patient is to use CBT, mindfulness and coping skills to help manage and/or decrease symptoms associated with their diagnosis. Symptoms:  Depressed/Irritable mood, worry, social withdrawal Problems Addressed:  Depressive thoughts, Sadness, Sleep issues, etc. Long Term Goals:  Pt to reduce overall level, frequency, and intensity of the feelings of depression/anxiety as evidenced by decreased irritability, negative self talk, and helpless feelings from  6 to 7 days/week to 0 to 1 days/week, per client report, for at least 3 consecutive months.  Progress: 30% Short Term Goals:  Pt to verbally express understanding of the relationship between feelings of depression/anxiety and their impact on thinking patterns and behaviors.  Pt to verbalize an understanding of the role that distorted thinking plays in creating fears, excessive worry, and ruminations.  Progress: 30% Target Date:  05/01/2024 Frequency:  Bi-weekly Modality:  Cognitive Behavioral  Therapy Interventions by Therapist:  Therapist will use CBT, Mindfulness exercises, Coping skills and Referrals, as needed by client. Client has verbally approved this treatment plan.  Karie Kirks, [facility]

## 2023-10-07 ENCOUNTER — Ambulatory Visit: Admitting: Psychology

## 2023-10-09 ENCOUNTER — Other Ambulatory Visit: Payer: Self-pay | Admitting: Family Medicine

## 2023-11-17 ENCOUNTER — Other Ambulatory Visit: Payer: Self-pay | Admitting: Family Medicine

## 2023-11-17 DIAGNOSIS — E1140 Type 2 diabetes mellitus with diabetic neuropathy, unspecified: Secondary | ICD-10-CM

## 2023-12-01 ENCOUNTER — Other Ambulatory Visit: Payer: Self-pay | Admitting: Family Medicine

## 2023-12-01 DIAGNOSIS — E1140 Type 2 diabetes mellitus with diabetic neuropathy, unspecified: Secondary | ICD-10-CM

## 2024-03-09 LAB — OPHTHALMOLOGY REPORT-SCANNED

## 2024-03-21 ENCOUNTER — Other Ambulatory Visit: Payer: Self-pay | Admitting: Family Medicine

## 2024-03-21 DIAGNOSIS — E119 Type 2 diabetes mellitus without complications: Secondary | ICD-10-CM

## 2024-04-12 ENCOUNTER — Telehealth: Payer: Self-pay | Admitting: Family Medicine

## 2024-04-12 ENCOUNTER — Ambulatory Visit: Admitting: Family Medicine

## 2024-04-12 VITALS — BP 135/80 | HR 61 | Resp 16 | Ht 71.0 in

## 2024-04-12 DIAGNOSIS — E119 Type 2 diabetes mellitus without complications: Secondary | ICD-10-CM

## 2024-04-12 DIAGNOSIS — Z7984 Long term (current) use of oral hypoglycemic drugs: Secondary | ICD-10-CM

## 2024-04-12 DIAGNOSIS — Z23 Encounter for immunization: Secondary | ICD-10-CM

## 2024-04-12 DIAGNOSIS — E1140 Type 2 diabetes mellitus with diabetic neuropathy, unspecified: Secondary | ICD-10-CM | POA: Diagnosis not present

## 2024-04-12 MED ORDER — EMPAGLIFLOZIN 25 MG PO TABS: 25.0000 mg | ORAL_TABLET | Freq: Every day | ORAL | 4 refills | Status: AC

## 2024-04-12 MED ORDER — RYBELSUS 3 MG PO TABS: 3.0000 mg | ORAL_TABLET | Freq: Every day | ORAL | 0 refills | Status: DC

## 2024-04-12 MED ORDER — GLIPIZIDE ER 10 MG PO TB24: 10.0000 mg | ORAL_TABLET | Freq: Every day | ORAL | 0 refills | Status: AC

## 2024-04-12 NOTE — Patient Instructions (Signed)
 SABRA  Please review the attached list of medications and notify my office if there are any errors.   . Please bring all of your medications to every appointment so we can make sure that our medication list is the same as yours.

## 2024-04-12 NOTE — Progress Notes (Signed)
 Established patient visit   Patient: Douglas Murray   DOB: [birth year]   53 y.o. Male  MRN: [id number] Visit Date: 04/12/2024  Today's healthcare provider: Nancyann Perry, MD   Chief Complaint  Patient presents with   Follow-up    F/u DM   Subjective    Discussed the use of AI scribe software for clinical note transcription with the patient, who gave verbal consent to proceed.  History of Present Illness   Douglas Murray is a 53 year old male with type 2 diabetes who presents for a follow-up visit.  He was last seen in February when his A1c was 9.6%. At that time, his glipizide  was increased to 10 mg daily. He continues to take metformin  and Jardiance , but has been out of Tradjenta  for a couple of weeks. His current A1c is 10.1%.  He has not been checking his blood sugar at home as often as recommended and acknowledges not adhering to a proper diet. He also mentions a decline in regular exercise due to changes in his work schedule.  He has been out of Tradjenta  for a couple of weeks. His current medications include glipizide  10 mg daily, metformin  twice daily, and Jardiance  25 mg daily.     Wt Readings from Last 3 Encounters:  08/15/23 226 lb 8 oz (102.7 kg)  06/09/23 231 lb (104.8 kg)  04/11/23 227 lb 6.4 oz (103.1 kg)   Lab Results  Component Value Date   HGBA1C 9.6 (H) 09/01/2023   HGBA1C 8.2 (A) 04/11/2023   HGBA1C 9.5 (A) 02/07/2023   Lab Results  Component Value Date   NA 145 (H) 09/01/2023   K 3.7 09/01/2023   CREATININE 1.11 09/01/2023   EGFR 80 09/01/2023   GLUCOSE 225 (H) 09/01/2023     Medications: Outpatient Medications Prior to Visit  Medication Sig   atenolol -chlorthalidone  (TENORETIC ) 50-25 MG tablet TAKE 1 TABLET BY MOUTH DAILY   busPIRone  (BUSPAR ) 5 MG tablet TAKE 1 TABLET BY MOUTH TWICE A DAY   famotidine  (PEPCID ) 20 MG tablet Take 1 tablet (20 mg total) by mouth daily.   glipiZIDE  (GLUCOTROL  XL) 10 MG 24 hr tablet Take 1  tablet (10 mg total) by mouth daily with breakfast.   hydrOXYzine  (ATARAX ) 50 MG tablet Take 1 tablet (50 mg total) by mouth at bedtime as needed and may repeat dose one time if needed (Insomnia and Anxiety).   JARDIANCE  25 MG TABS tablet TAKE 1 TABLET (25 MG TOTAL) BY MOUTH DAILY.   KLOR-CON  M20 20 MEQ tablet TAKE 1 TABLET BY MOUTH EVERY DAY   metFORMIN  (GLUCOPHAGE -XR) 500 MG 24 hr tablet TAKE 2 TABLETS BY MOUTH DAILY  WITH BREAKFAST   omeprazole  (PRILOSEC) 20 MG capsule Take 1 capsule (20 mg total) by mouth daily.   pravastatin  (PRAVACHOL ) 80 MG tablet TAKE 1 TABLET BY MOUTH DAILY   [DISCONTINUED] linagliptin  (TRADJENTA ) 5 MG TABS tablet TAKE 1 TABLET (5 MG TOTAL) BY MOUTH DAILY. TAKE IN PLACE OF ONGLYZA   No facility-administered medications prior to visit.   Review of Systems  Constitutional:  Negative for appetite change, chills and fever.  Respiratory:  Negative for chest tightness, shortness of breath and wheezing.   Cardiovascular:  Negative for chest pain and palpitations.  Gastrointestinal:  Negative for abdominal pain, nausea and vomiting.       Objective    BP 135/80 (BP Location: Right Arm, Patient Position: Sitting, Cuff Size: Large)   Pulse 61  Resp 16   Ht 5' 11 (1.803 m)   SpO2 100%   BMI 31.59 kg/m   Physical Exam   General: Appearance:    Obese male in no acute distress  Eyes:    PERRL, conjunctiva/corneas clear, EOM's intact       Lungs:     Clear to auscultation bilaterally, respirations unlabored  Heart:    Normal heart rate. Normal rhythm. No murmurs, rubs, or gallops.    MS:   All extremities are intact.    Neurologic:   Awake, alert, oriented x 3. No apparent focal neurological defect.       A1c=10.1   Assessment & Plan    1. Type 2 diabetes mellitus with diabetic neuropathy, without long-term current use of insulin (HCC) Uncontrolled. He is going to work on consuming healthier diet and exercising more.   - Semaglutide  (RYBELSUS ) 3 MG TABS; Take  1 tablet (3 mg total) by mouth daily.  Dispense: 30 tablet; Refill: 0 Advised to let me know if any adverse effects, otherwise increase to 7mg  on 4 weeks.   2. Need for influenza vaccination (Primary)   - Flu vaccine trivalent PF, 6mos and older(Flulaval,Afluria,Fluarix,Fluzone)  Refill all meds to [facility].  Return in about 6 weeks (around 05/24/2024).     Nancyann Perry, MD  [facility] [phone] (phone) [phone] (fax)  [facility]

## 2024-04-13 NOTE — Telephone Encounter (Signed)
Prescription was sent yesterday.

## 2024-04-15 ENCOUNTER — Other Ambulatory Visit (HOSPITAL_COMMUNITY): Payer: Self-pay

## 2024-04-15 ENCOUNTER — Telehealth: Payer: Self-pay

## 2024-04-15 NOTE — Telephone Encounter (Signed)
 Pharmacy Patient Advocate Encounter   Received notification from Onbase that prior authorization for Rybelsus  3MG  tablets is required/requested.   Insurance verification completed.   The patient is insured through [facility] .   Per test claim: PA required; PA submitted to above mentioned insurance via Latent Key/confirmation #/EOC [id number] Status is pending

## 2024-04-15 NOTE — Telephone Encounter (Signed)
 Pharmacy Patient Advocate Encounter  Received notification from [facility] that Prior Authorization for Rybelsus  3MG  tablets has been APPROVED from 03/26/2024 to 04/15/2025   PA #/Case ID/Reference #: [id number]

## 2024-04-16 ENCOUNTER — Other Ambulatory Visit (HOSPITAL_COMMUNITY): Payer: Self-pay

## 2024-04-16 ENCOUNTER — Other Ambulatory Visit: Payer: Self-pay | Admitting: Family Medicine

## 2024-04-16 DIAGNOSIS — E1140 Type 2 diabetes mellitus with diabetic neuropathy, unspecified: Secondary | ICD-10-CM

## 2024-05-10 ENCOUNTER — Other Ambulatory Visit: Payer: Self-pay | Admitting: Family Medicine

## 2024-05-10 DIAGNOSIS — E1140 Type 2 diabetes mellitus with diabetic neuropathy, unspecified: Secondary | ICD-10-CM

## 2024-05-10 LAB — POCT GLYCOSYLATED HEMOGLOBIN (HGB A1C): Hemoglobin A1C: 10.1 % — AB (ref 4.0–5.6)

## 2024-05-10 MED ORDER — RYBELSUS 7 MG PO TABS: 7.0000 mg | ORAL_TABLET | Freq: Every day | ORAL | 2 refills | Status: DC

## 2024-05-31 ENCOUNTER — Encounter: Payer: Self-pay | Admitting: Family Medicine

## 2024-05-31 ENCOUNTER — Ambulatory Visit: Admitting: Family Medicine

## 2024-05-31 VITALS — BP 119/84 | HR 71 | Ht 71.0 in | Wt 221.0 lb

## 2024-05-31 DIAGNOSIS — Z23 Encounter for immunization: Secondary | ICD-10-CM

## 2024-05-31 DIAGNOSIS — E876 Hypokalemia: Secondary | ICD-10-CM | POA: Diagnosis not present

## 2024-05-31 DIAGNOSIS — Z7984 Long term (current) use of oral hypoglycemic drugs: Secondary | ICD-10-CM | POA: Diagnosis not present

## 2024-05-31 DIAGNOSIS — E1140 Type 2 diabetes mellitus with diabetic neuropathy, unspecified: Secondary | ICD-10-CM | POA: Diagnosis not present

## 2024-05-31 MED ORDER — POTASSIUM CHLORIDE CRYS ER 20 MEQ PO TBCR: 20.0000 meq | EXTENDED_RELEASE_TABLET | Freq: Every day | ORAL | 3 refills | Status: AC

## 2024-05-31 NOTE — Patient Instructions (Signed)
 SABRA  Please review the attached list of medications and notify my office if there are any errors.   . Please bring all of your medications to every appointment so we can make sure that our medication list is the same as yours.

## 2024-05-31 NOTE — Progress Notes (Signed)
 Established patient visit   Patient: Douglas Murray   DOB: 1971/02/18   53 y.o. Male  MRN: [id number] Visit Date: 05/31/2024  Today's healthcare provider: Nancyann Perry, MD   Chief Complaint  Patient presents with   Medical Management of Chronic Issues    T2DM Following up on Rybelsus    Medication Refill    Patient would ike to have his prescription to go to [facility]   Subjective    Discussed the use of AI scribe software for clinical note transcription with the patient, who gave verbal consent to proceed.  History of Present Illness   Douglas Murray is a 53 year old male with type 2 diabetes who presents for follow-up on Rybelsus  treatment.  He has experienced mild constipation since starting Rybelsus , but no significant gastrointestinal side effects such as stomach upset. He reports his blood sugar readings are currently around 180 mg/dL, whereas previously they were closer to 200 mg/dL, and he has not had any episodes of hypoglycemia.  He continues to take Jardiance  and metformin  as part of his diabetes management. He notes a decrease in appetite and has observed some weight loss since starting Rybelsus .  He has recently changed his healthcare provider to [employer], which has complicated his prescription management. He is currently obtaining his medications from [facility].     Lab Results  Component Value Date   HGBA1C 10.1 (A) 04/12/2024   HGBA1C 9.6 (H) 09/01/2023   HGBA1C 8.2 (A) 04/11/2023     Medications: Outpatient Medications Prior to Visit  Medication Sig   atenolol -chlorthalidone  (TENORETIC ) 50-25 MG tablet TAKE 1 TABLET BY MOUTH DAILY   empagliflozin  (JARDIANCE ) 25 MG TABS tablet Take 1 tablet (25 mg total) by mouth daily.   famotidine  (PEPCID ) 20 MG tablet Take 1 tablet (20 mg total) by mouth daily.   glipiZIDE  (GLUCOTROL  XL) 10 MG 24 hr tablet Take 1 tablet (10 mg total) by mouth daily with breakfast.   metFORMIN   (GLUCOPHAGE -XR) 500 MG 24 hr tablet TAKE 2 TABLETS BY MOUTH DAILY  WITH BREAKFAST   omeprazole  (PRILOSEC) 20 MG capsule Take 1 capsule (20 mg total) by mouth daily.   pravastatin  (PRAVACHOL ) 80 MG tablet TAKE 1 TABLET BY MOUTH DAILY   [DISCONTINUED] KLOR-CON  M20 20 MEQ tablet TAKE 1 TABLET BY MOUTH EVERY DAY   busPIRone  (BUSPAR ) 5 MG tablet TAKE 1 TABLET BY MOUTH TWICE A DAY (Patient not taking: Reported on 04/12/2024)   hydrOXYzine  (ATARAX ) 50 MG tablet Take 1 tablet (50 mg total) by mouth at bedtime as needed and may repeat dose one time if needed (Insomnia and Anxiety).   Semaglutide  (RYBELSUS ) 7 MG TABS Take 1 tablet (7 mg total) by mouth daily.   [DISCONTINUED] Semaglutide  (RYBELSUS ) 3 MG TABS Take 1 tablet (3 mg total) by mouth daily.   No facility-administered medications prior to visit.   Review of Systems  Constitutional:  Negative for appetite change, chills and fever.  Respiratory:  Negative for chest tightness, shortness of breath and wheezing.   Cardiovascular:  Negative for chest pain and palpitations.  Gastrointestinal:  Negative for abdominal pain, nausea and vomiting.       Objective    BP 119/84 (BP Location: Right Arm, Patient Position: Sitting, Cuff Size: Large)   Pulse 71   Ht 5' 11 (1.803 m)   Wt 221 lb (100.2 kg)   SpO2 97%   BMI 30.82 kg/m   Physical Exam   General: Appearance:  Mildly obese male in no acute distress  Eyes:    PERRL, conjunctiva/corneas clear, EOM's intact       Lungs:     Clear to auscultation bilaterally, respirations unlabored  Heart:    Normal heart rate. Normal rhythm. No murmurs, rubs, or gallops.    MS:   All extremities are intact.    Neurologic:   Awake, alert, oriented x 3. No apparent focal neurological defect.          Assessment & Plan    1. Type 2 diabetes mellitus with diabetic neuropathy, without long-term current use of insulin (HCC) (Primary) Tolerating addition of Rybelsus  and titration to 7mg . Slightly weight  loss. Continue current medications.  Follow up in January for CPE and DM follow up.   2. Hypokalemia refill- potassium chloride  SA (KLOR-CON  M20) 20 MEQ tablet; Take 1 tablet (20 mEq total) by mouth daily.  Dispense: 90 tablet; Refill: 3  3. Immunization due  - Pneumococcal conjugate vaccine 20-valent - Varicella-zoster vaccine IM  Return in about 11 weeks (around 08/16/2024) for Yearly Physical, Diabetes.     Nancyann Perry, MD  [facility] [phone] (phone) [phone] (fax)  [facility]

## 2024-08-11 ENCOUNTER — Other Ambulatory Visit: Payer: Self-pay | Admitting: Family Medicine

## 2024-08-11 DIAGNOSIS — E1140 Type 2 diabetes mellitus with diabetic neuropathy, unspecified: Secondary | ICD-10-CM

## 2024-08-16 ENCOUNTER — Encounter: Admitting: Family Medicine

## 2024-09-24 ENCOUNTER — Ambulatory Visit: Admitting: Family Medicine
